# Patient Record
Sex: Male | Born: 1955 | Race: Black or African American | Hispanic: No | Marital: Single | Smoking: Former smoker
Health system: Southern US, Community
[De-identification: ages and names within clinical notes are randomized; demographics above are authoritative.]

---

## 2005-05-11 ENCOUNTER — Emergency Department (HOSPITAL_COMMUNITY): Admission: EM | Admit: 2005-05-11 | Discharge: 2005-05-11 | Payer: Self-pay | Admitting: Emergency Medicine

## 2005-05-17 ENCOUNTER — Inpatient Hospital Stay (HOSPITAL_COMMUNITY): Admission: EM | Admit: 2005-05-17 | Discharge: 2005-05-21 | Payer: Self-pay | Admitting: Emergency Medicine

## 2006-09-13 IMAGING — CR DG ABDOMEN ACUTE W/ 1V CHEST
3 series · 3 of 3 positions shown · non-contrast
Comparison: None

CLINICAL DATA: Abdominal pain

ABDOMEN SERIES - 2 VIEW & CHEST - 1 VIEW

[w chest pa *]
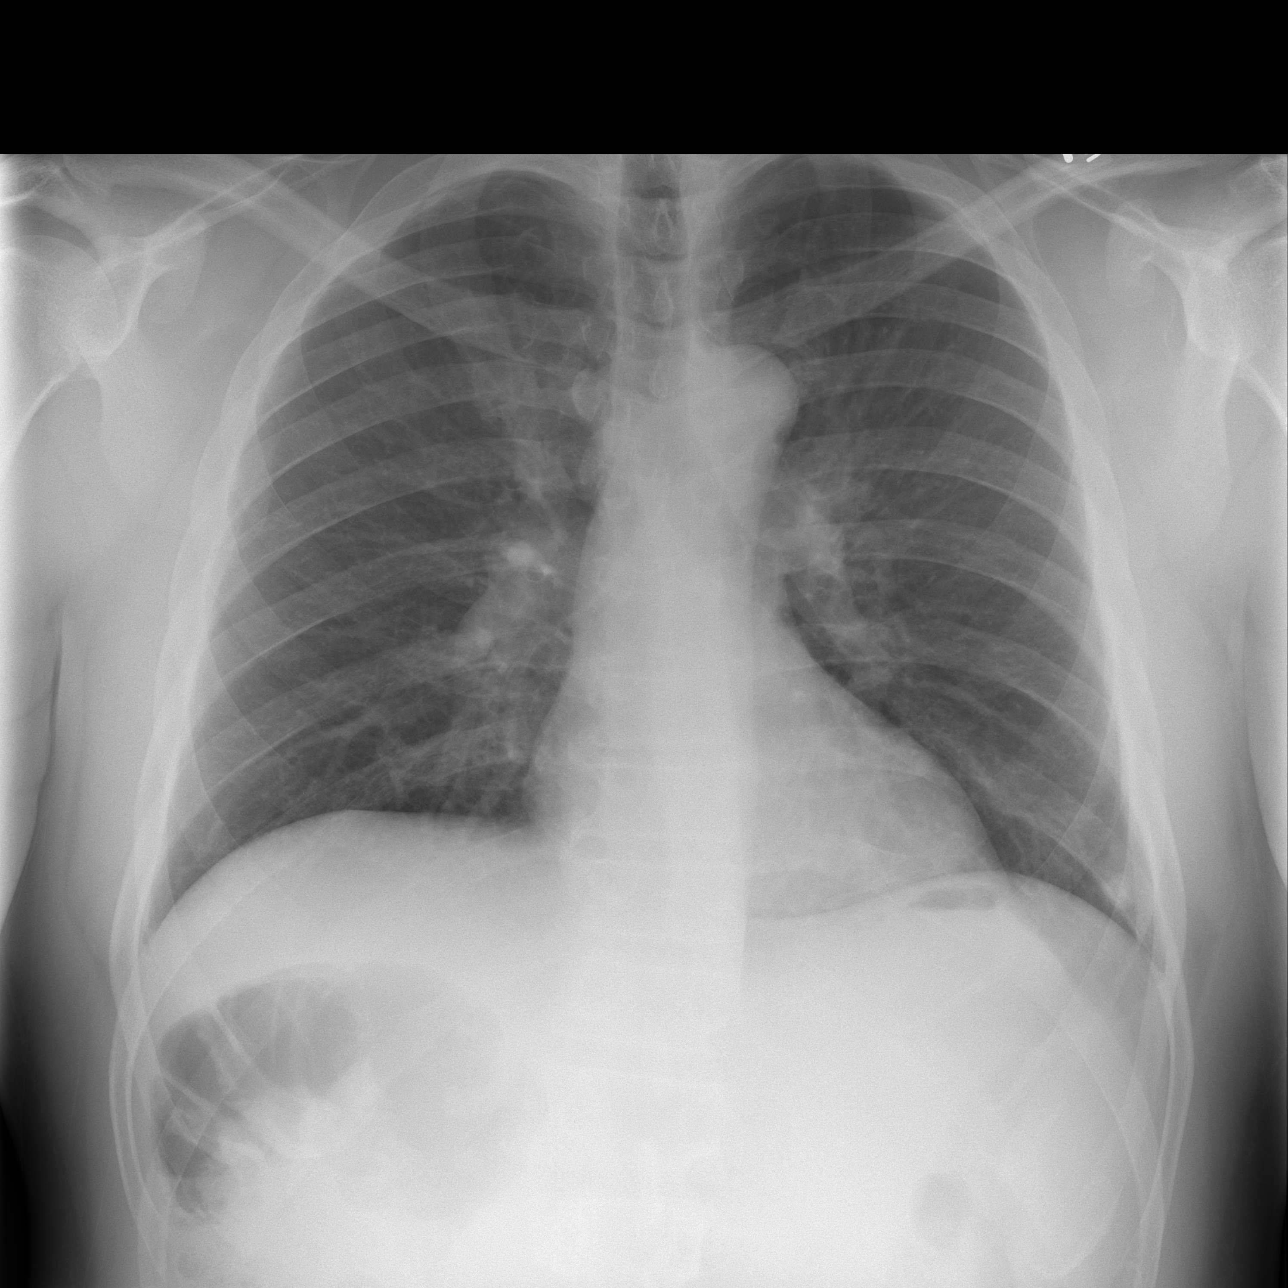

[w abdomen upright *]
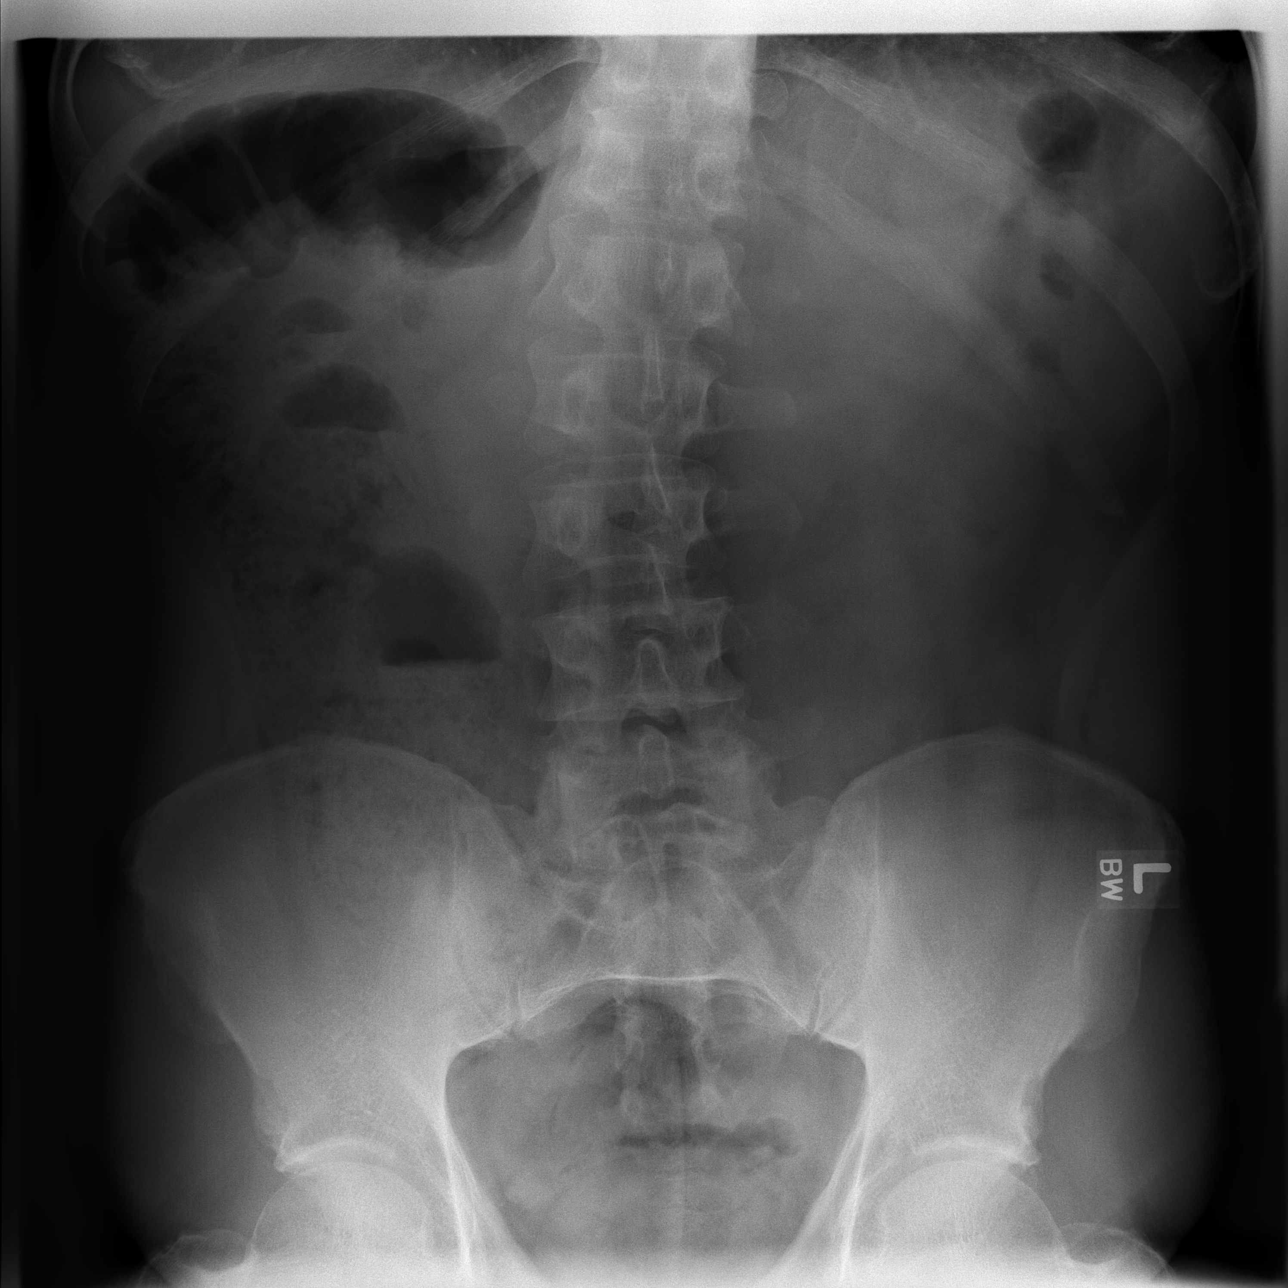

[t abdomen supine]
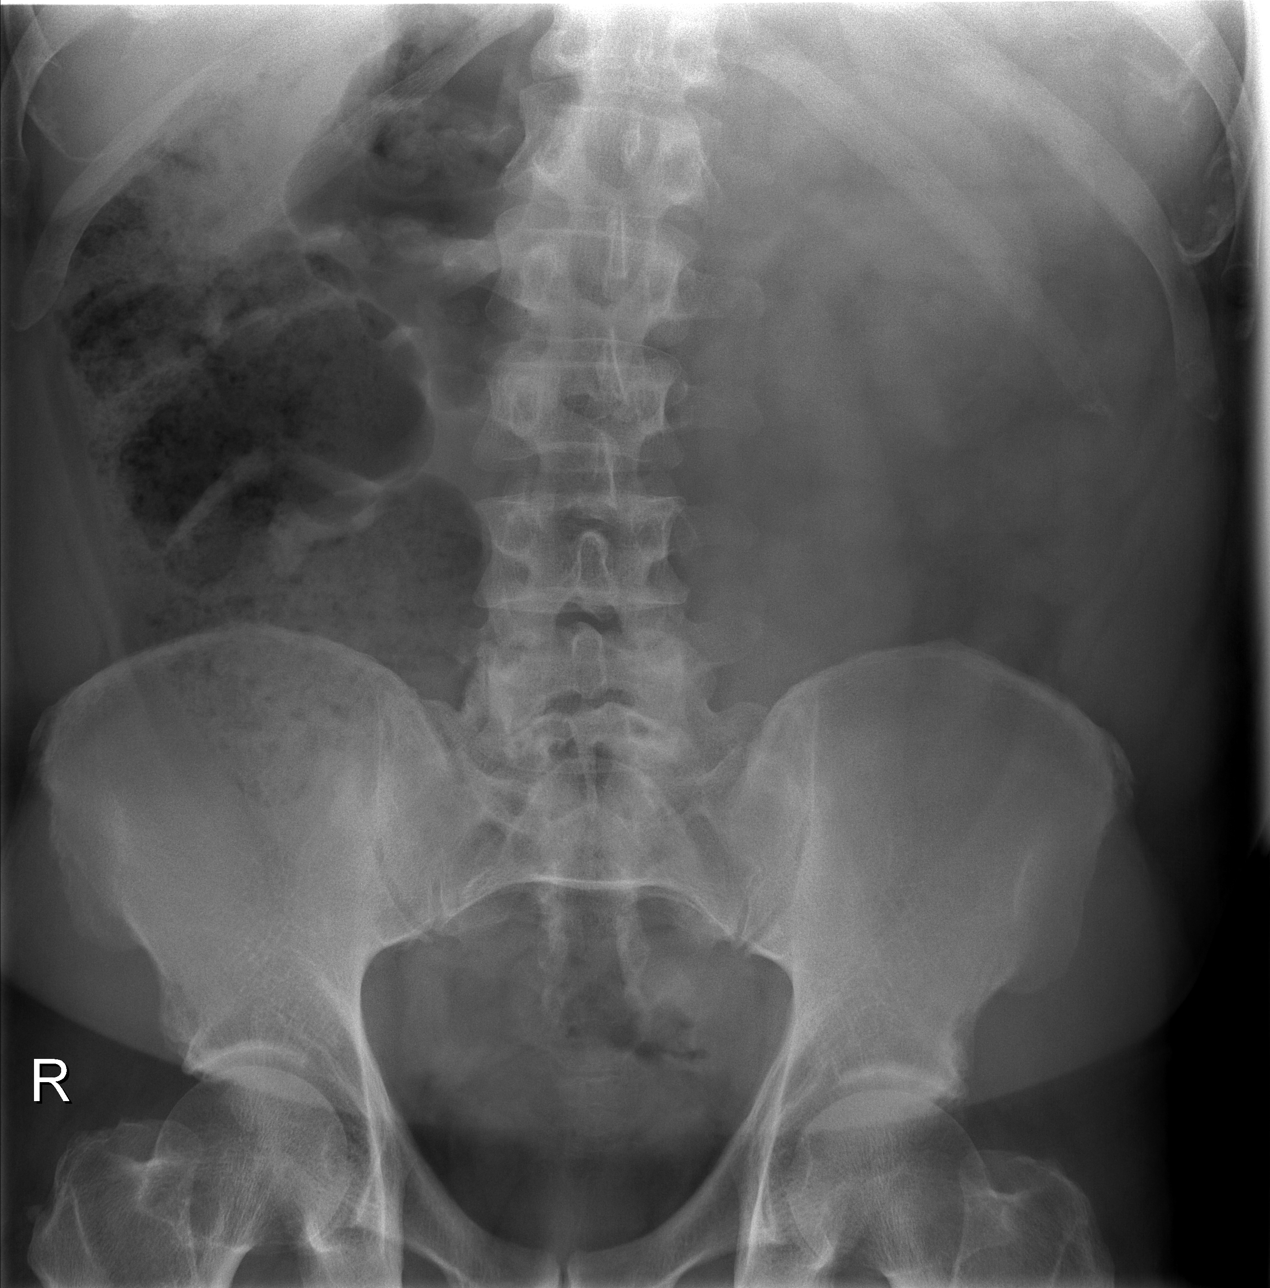

[3 of 3 positions shown; findings below may reference images not displayed]

FINDINGS: Heart and mediastinal contours are within normal limits. Right lung
clear. There is a nodular density projecting in the left costophrenic angle.
This may represent old granuloma, but cannot completely exclude other cause of
pulmonary nodule.

There is a large amount stool in the right side of the colon. No evidence of
bowel obstruction or free air. No organomegaly or suspicious calcification. Mild
scoliosis of the lumbar spine.

IMPRESSION

Constipation. No obstruction or free air.

Nodular density in the left costophrenic angle, question calcified granuloma. We
have no comparison films in our system. Therefore I recommend chest CT for
further evaluation.

## 2006-09-19 IMAGING — RF DG CHOLANGIOGRAM OPERATIVE
1 series · 4 of 4 positions shown · non-contrast
Comparison: No similar prior study is available.

CLINICAL DATA: Cholelithiasis without obstruction.  
 INTRAOPERATIVE CHOLANGIOGRAM:

[Series 1: run · 4 of 115 frames shown]
[frame 18/115]
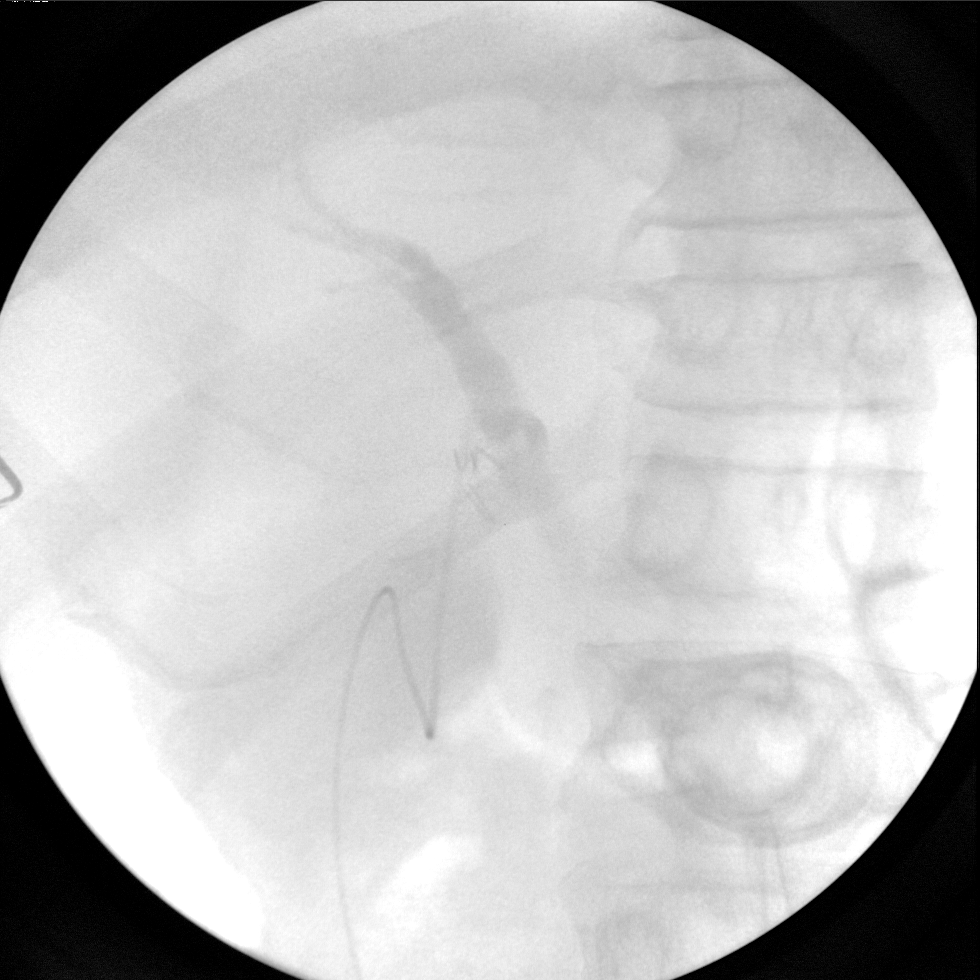
[frame 58/115]
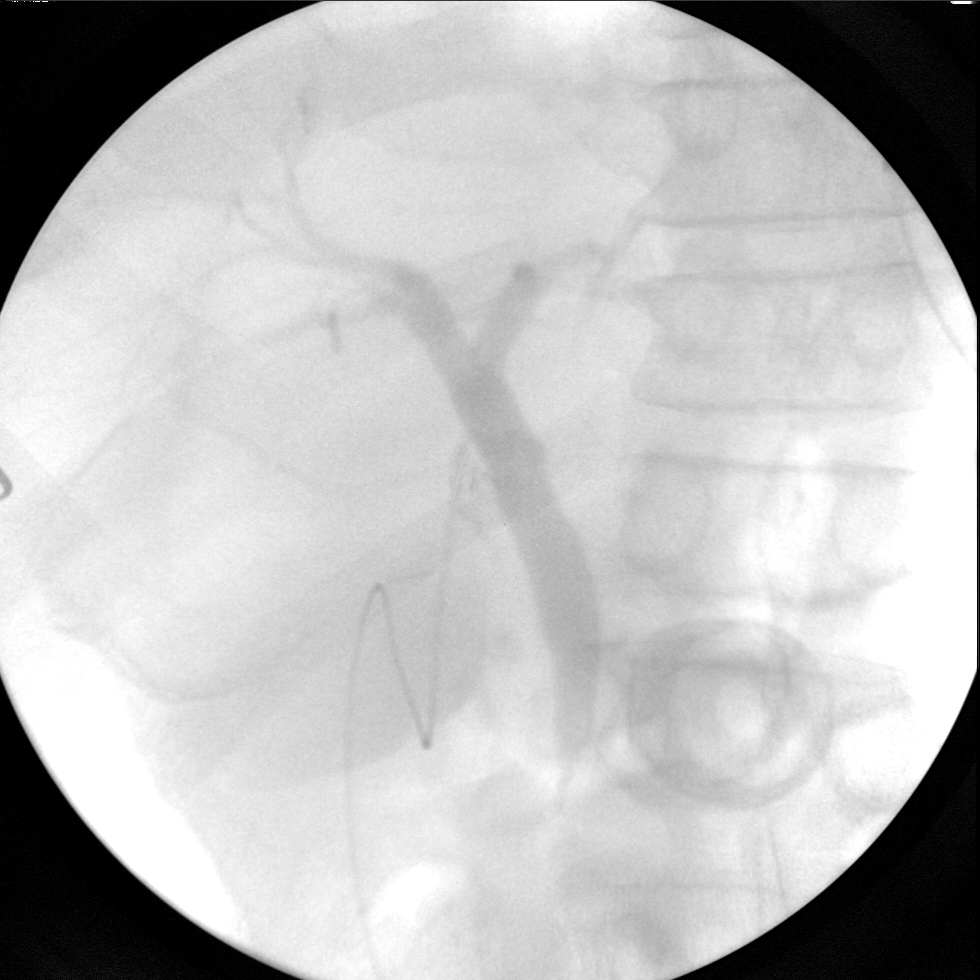
[frame 98/115]
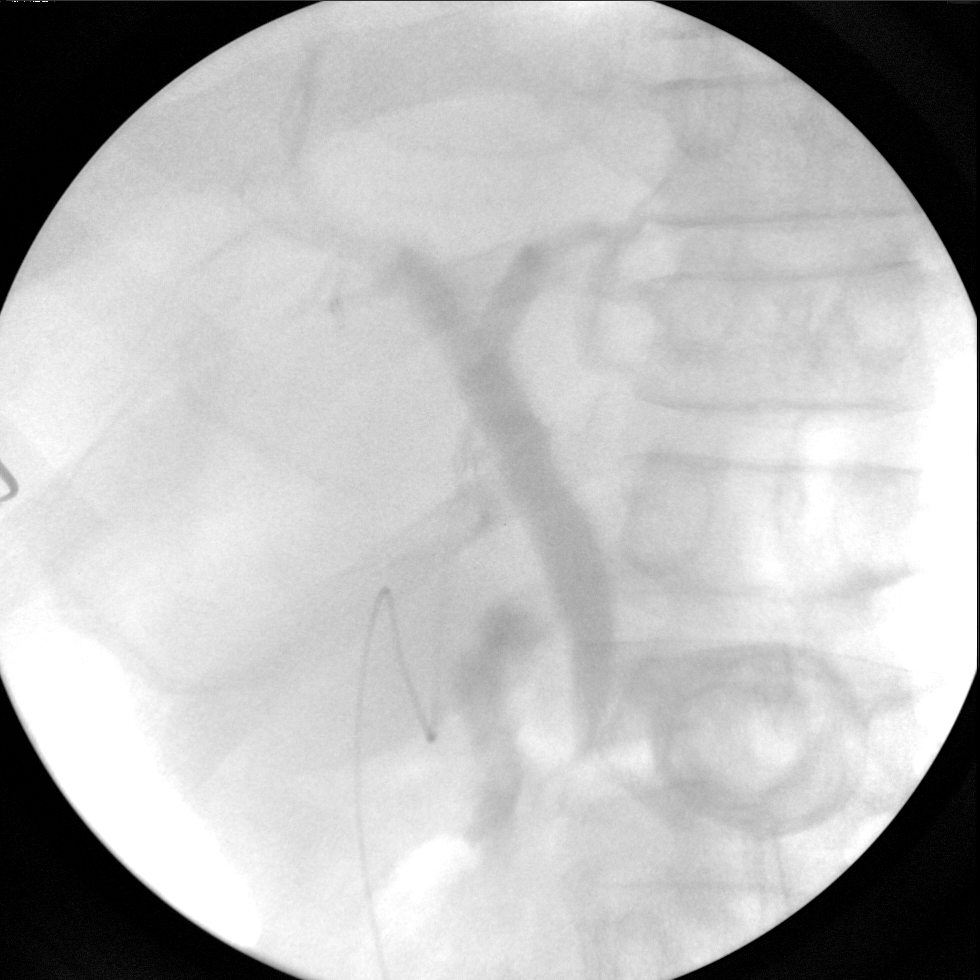
[frame 103/115]
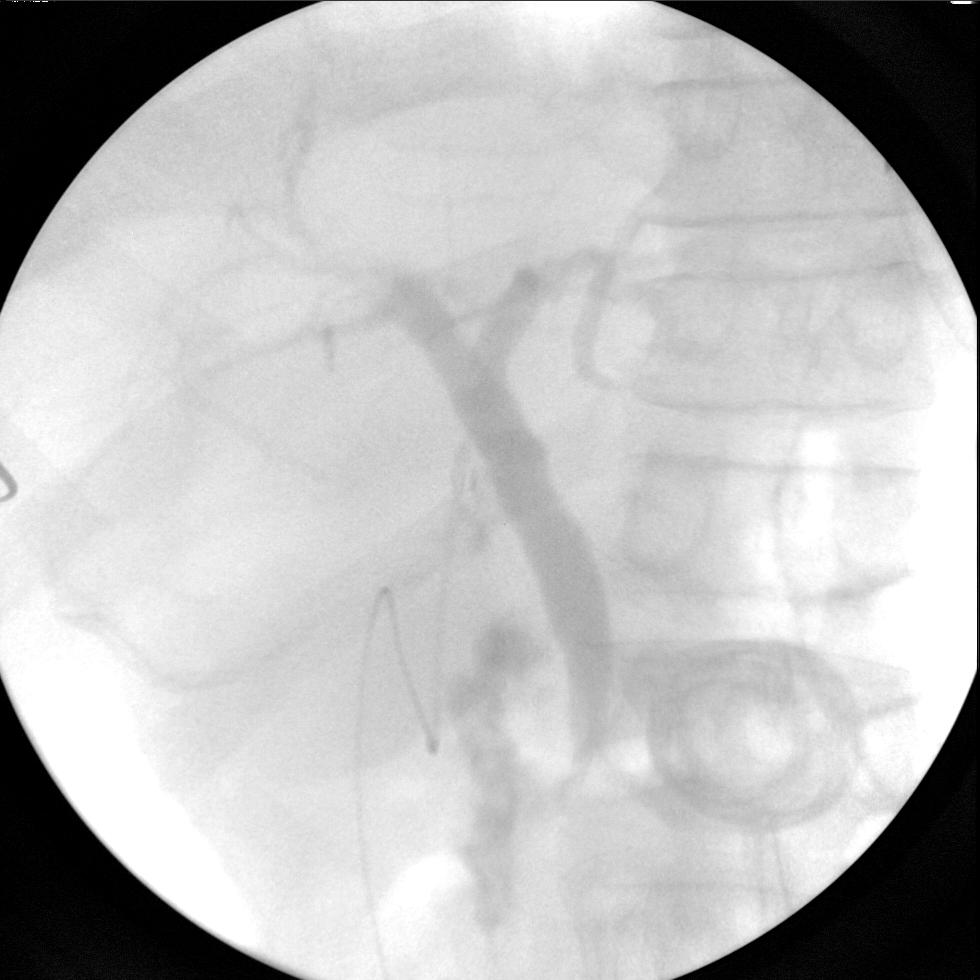

[4 of 4 positions shown; findings below may reference images not displayed]

FINDINGS: A catheter is seen over the right upper quadrant with multiple fluoroscopic images demonstrating instillation of contrast into the common duct.  The main and left and right bile ducts opacify with contrast.  No filling defect is seen in the common hepatic or common bile duct.  There is passage of contrast into the duodenum.
IMPRESSION: Intraoperative cholangiogram demonstrates opacification of the main and right and left bile ducts without filling defect or obstruction to passage into the duodenum.

## 2006-09-19 IMAGING — US US ABDOMEN COMPLETE
1 series · 14 of 25 positions shown · non-contrast
Comparison: none

CLINICAL DATA: Abdominal pain. 
 ABDOMEN ULTRASOUND:
TECHNIQUE: Complete abdominal ultrasound examination was performed including evaluation of the liver, gallbladder, bile ducts, pancreas, kidneys, spleen, IVC, and abdominal aorta.

[Series 1: abdomen · 0.33mm/px · 14 of 90 slices shown]
[im 1/90]
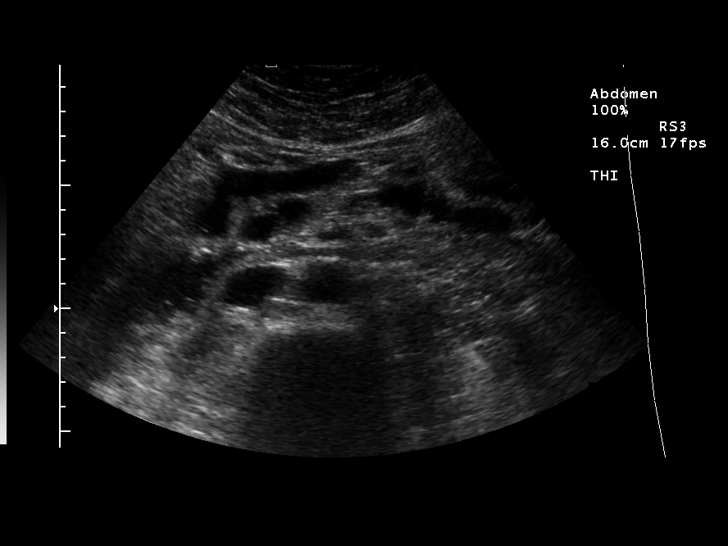
[im 8/90]
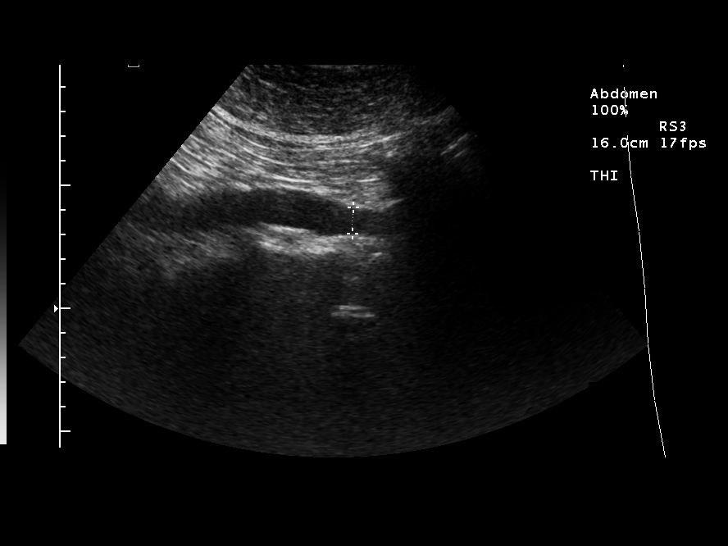
[im 15/90]
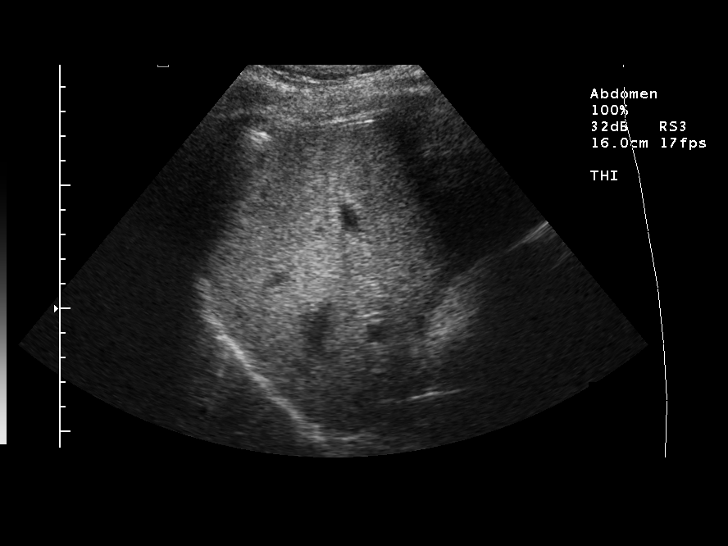
[im 23/90]
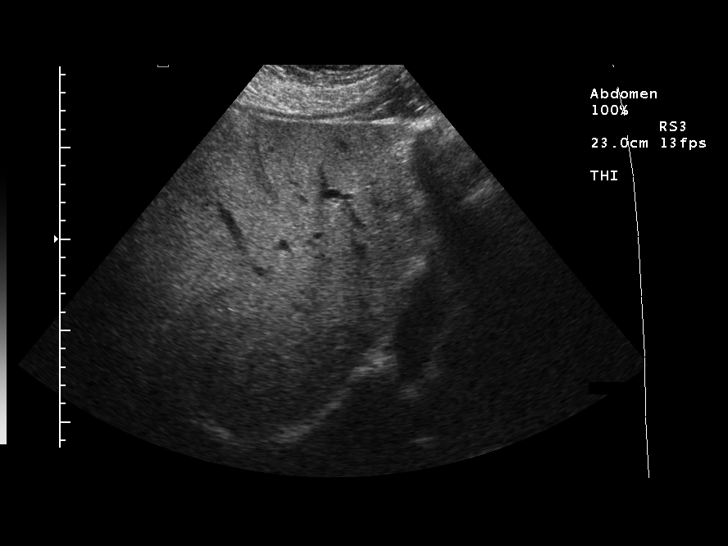
[im 30/90]
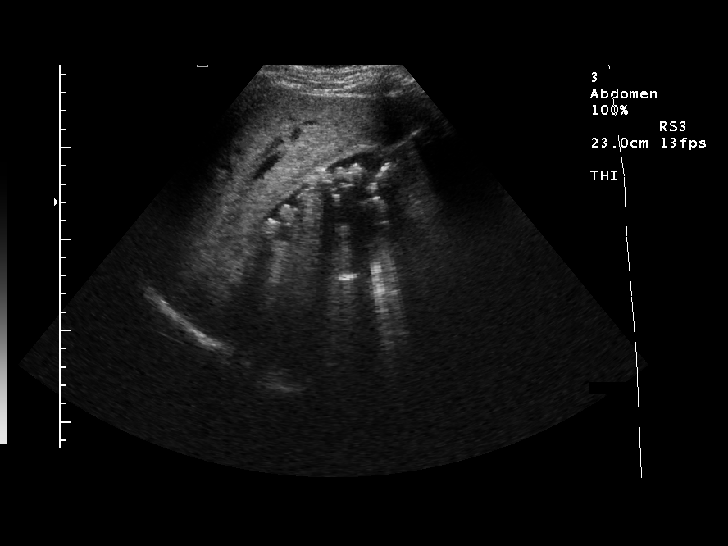
[im 34/90]
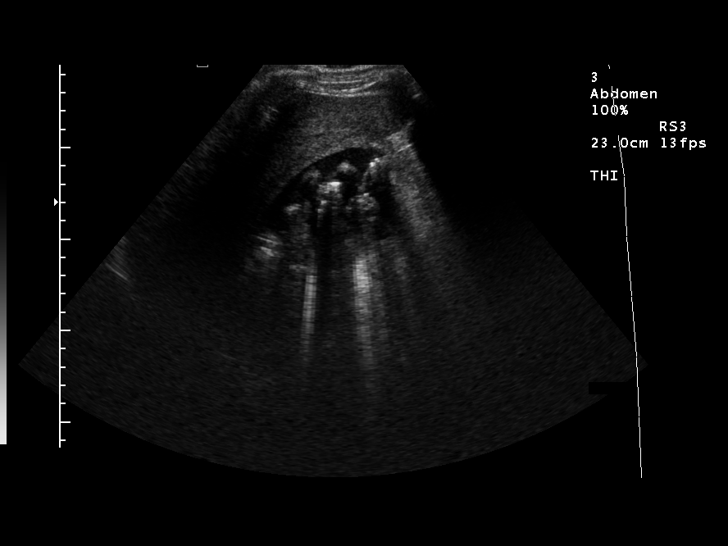
[im 41/90]
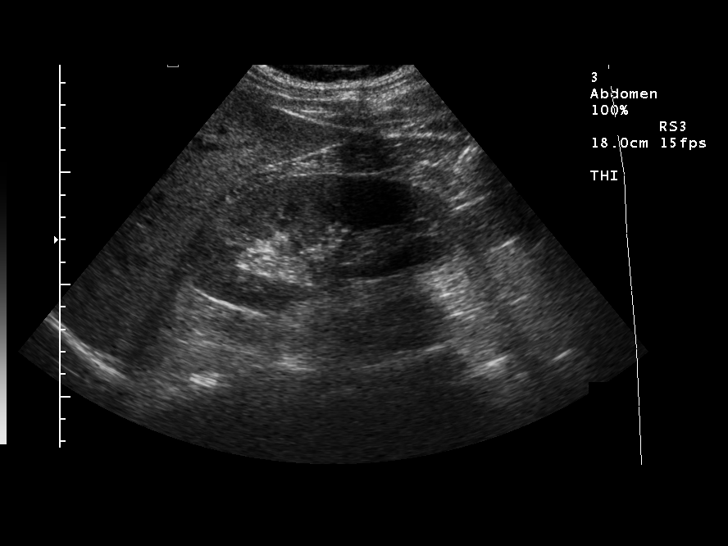
[im 49/90]
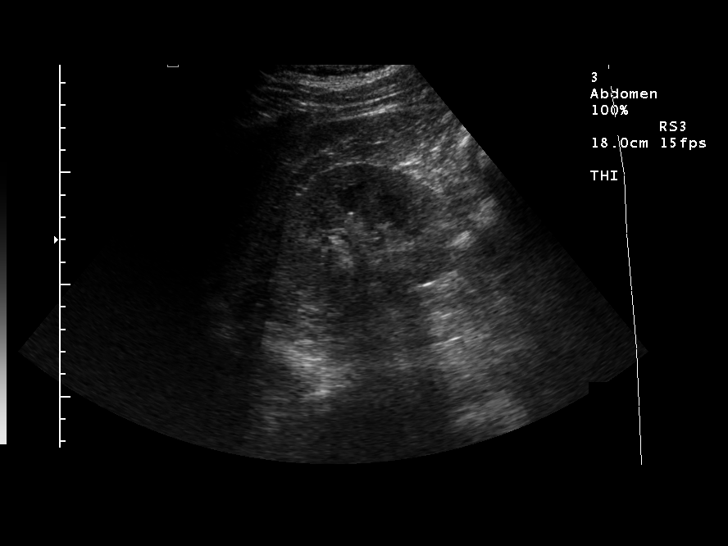
[im 56/90]
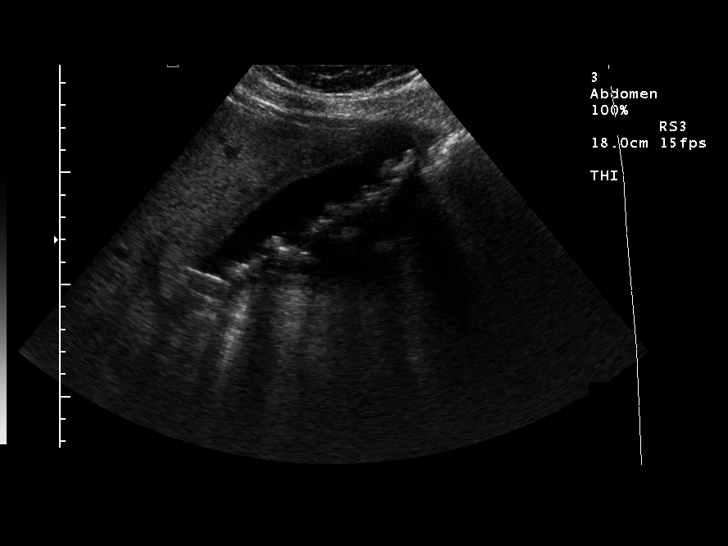
[im 60/90]
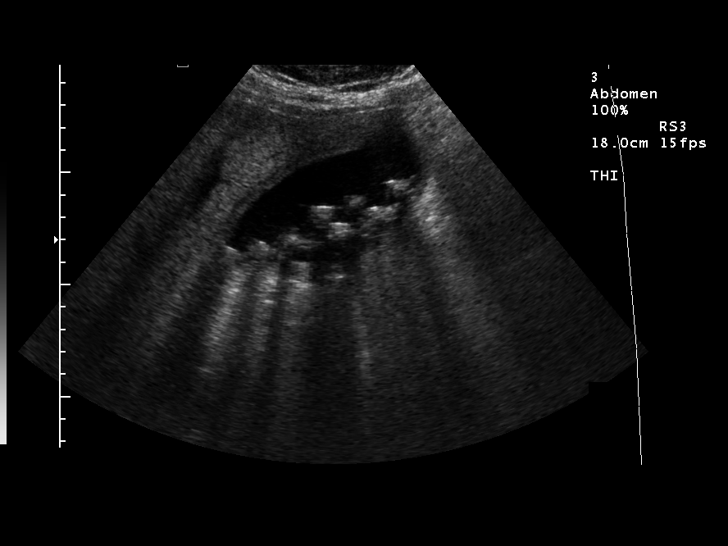
[im 67/90]
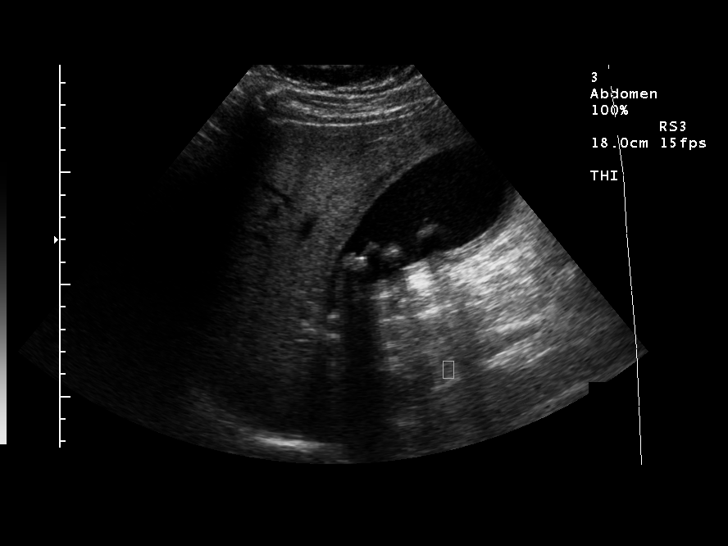
[im 75/90]
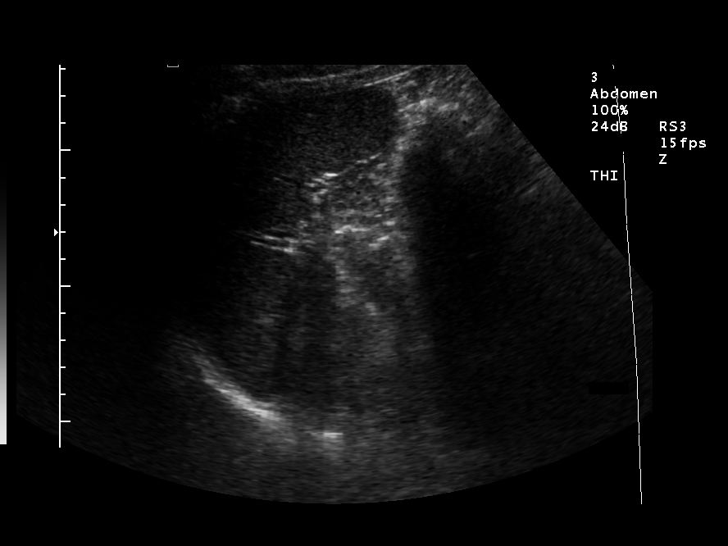
[im 82/90]
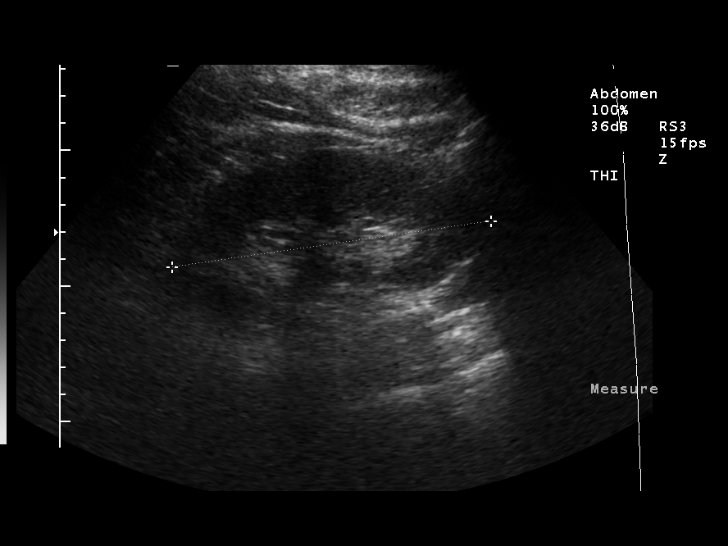
[im 90/90]
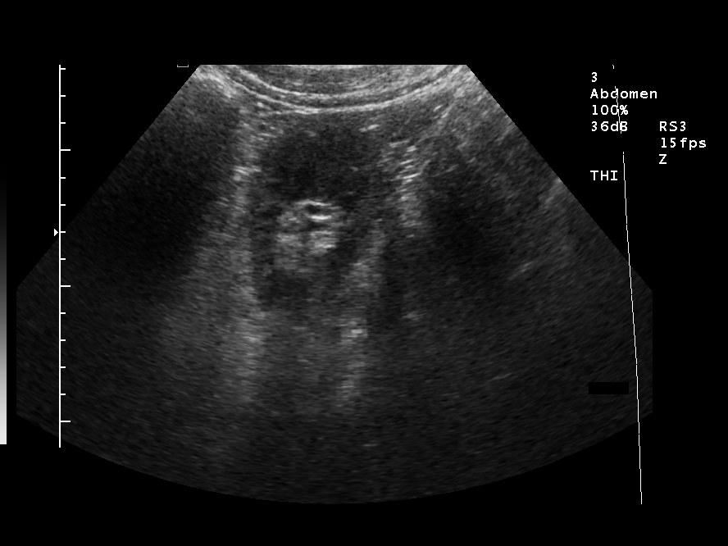

[14 of 25 positions shown; findings below may reference images not displayed]

FINDINGS: Multiple gallstones are noted with mild thickening of the gallbladder wall.  The patient is tender over the gallbladder.  No definite wall edema or pericholecystic fluid.  No intrahepatic biliary dilatation.  The common bile duct could not be visualized.  No focal lesions of the liver or spleen.  The pancreas and proximal aorta mostly obscured by bile gas.  Kidney size unremarkable without stones or obstruction.  IVC is not well visualized.
IMPRESSION: Cholelithiasis with gallbladder wall thickening and tenderness over the gallbladder.

## 2006-09-20 IMAGING — CT CT CHEST W/ CM
1 of 2 series · 14 of 32 positions shown, 18 images · IV contrast (100 ML OMNI 300)
Comparison: Chest radiograph dated 05/11/2005.

CLINICAL DATA: Nodular density at the left costophrenic angle on a recent chest
radiograph. Cholecystectomy yesterday.

CHEST CT WITH CONTRAST
TECHNIQUE: Multidetector CT imaging of the chest was performed following the
standard protocol during bolus administration of intravenous contrast.
Contrast:  100 cc Omnipaque 300

[Series 2: routine chest · axial · 0.78mm/px · z∈[-316,-61]mm · 14 of 61 slices shown, 18 images]
[im 5/61  mediastinal]
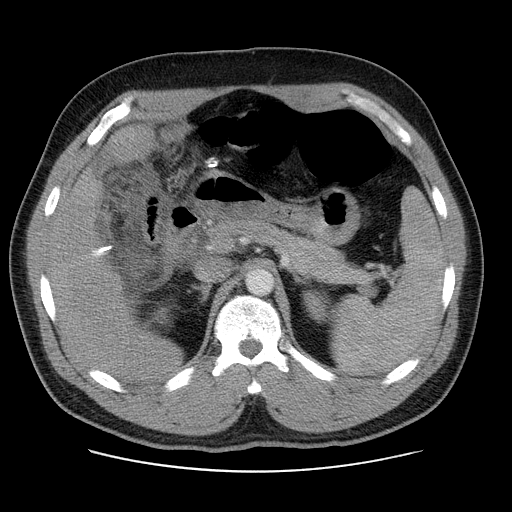
[im 5/61  lung]
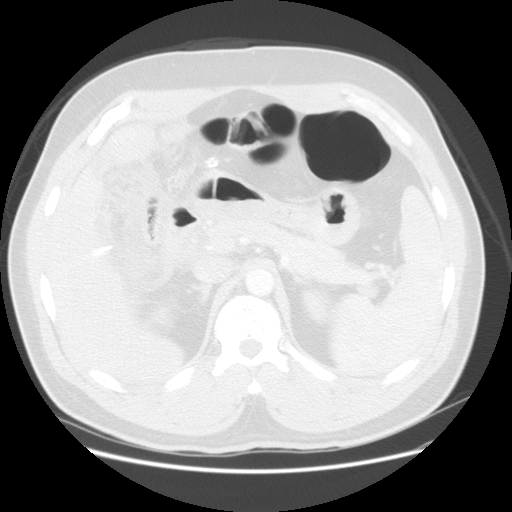
[im 10/61  lung]
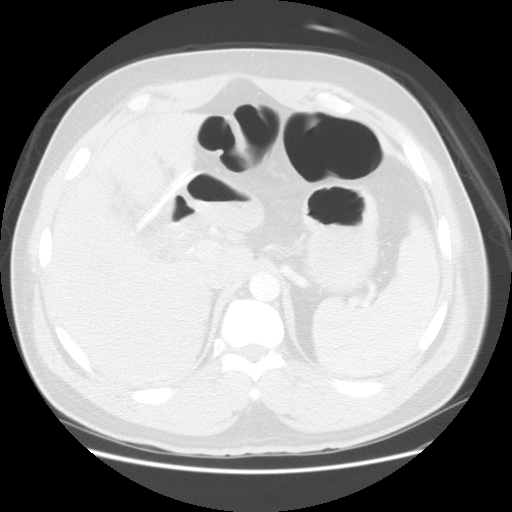
[im 14/61  lung]
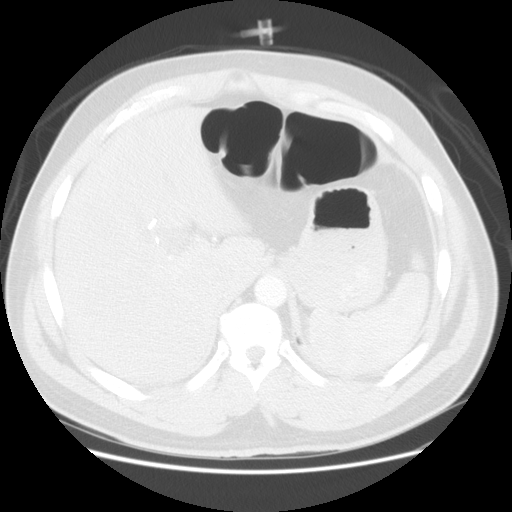
[im 19/61  lung]
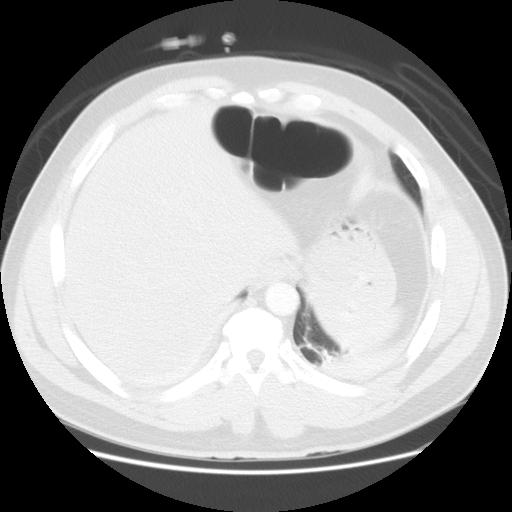
[im 24/61  mediastinal]
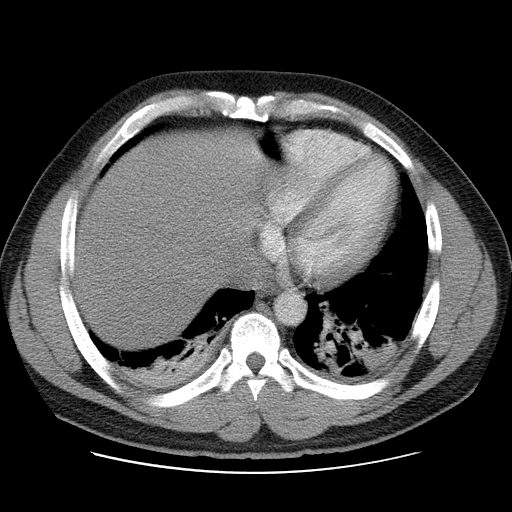
[im 24/61  lung]
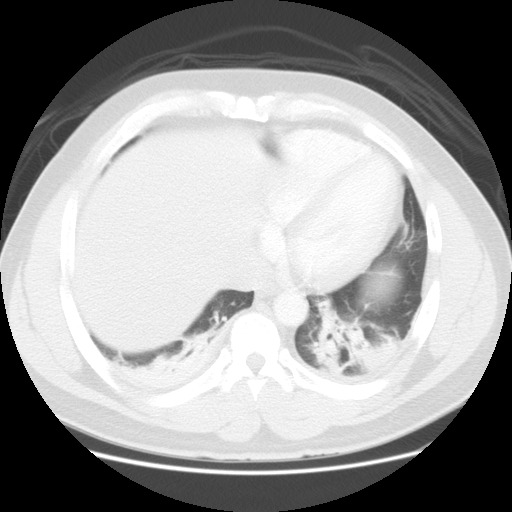
[im 28/61  lung]
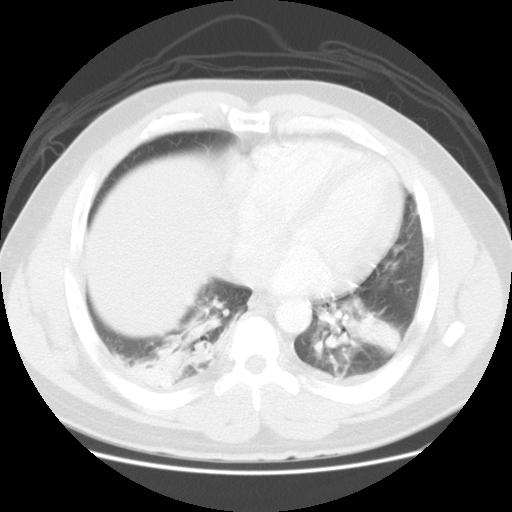
[im 30/61  lung]
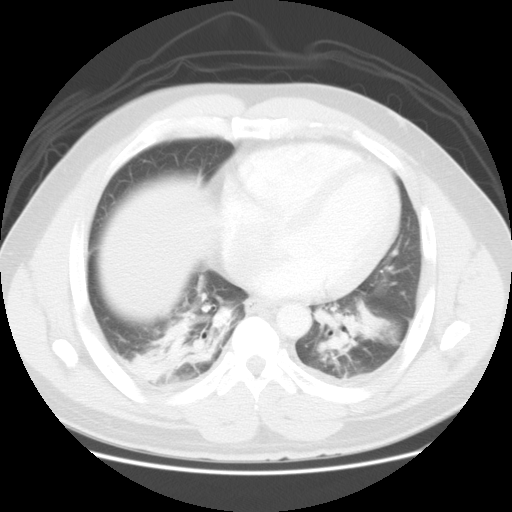
[im 31/61  lung]
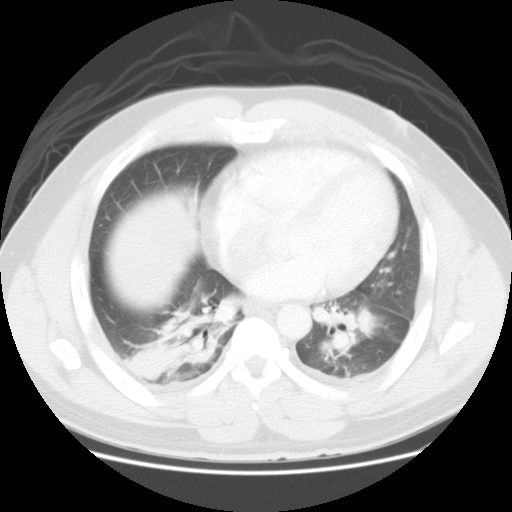
[im 33/61  mediastinal]
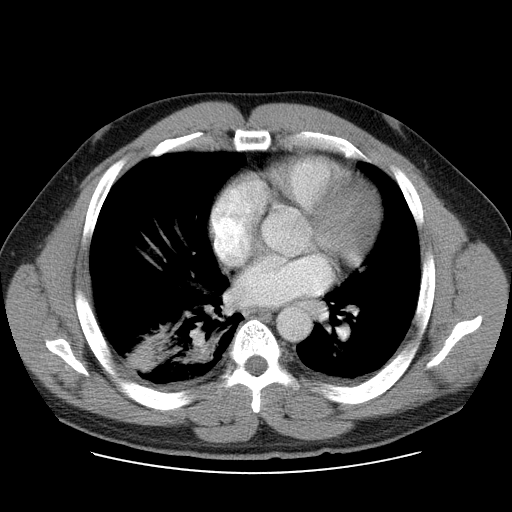
[im 33/61  lung]
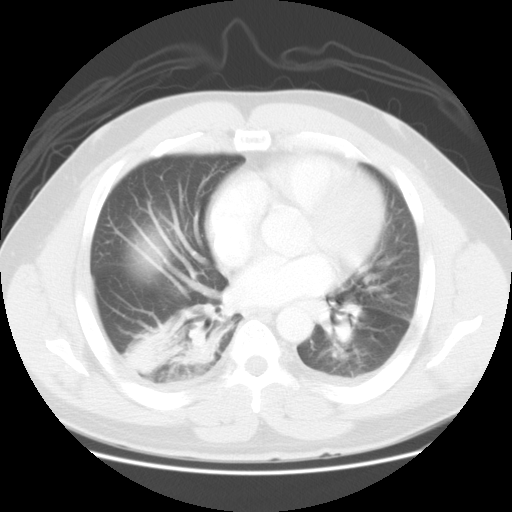
[im 37/61  lung]
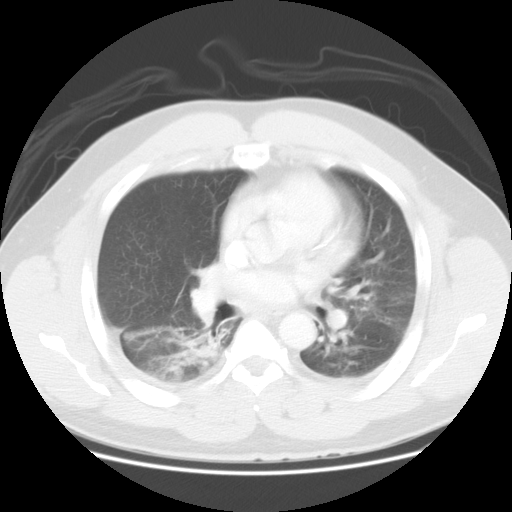
[im 42/61  lung]
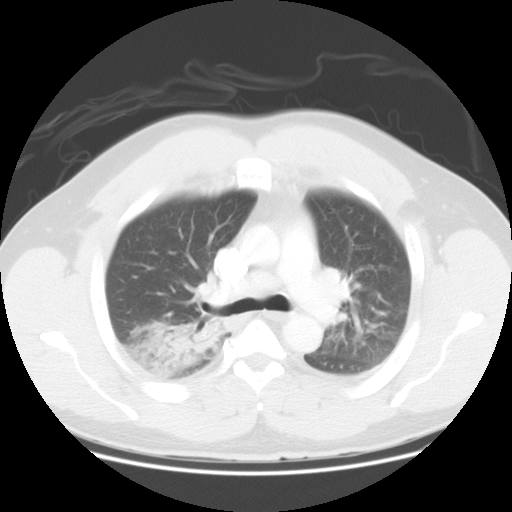
[im 47/61  lung]
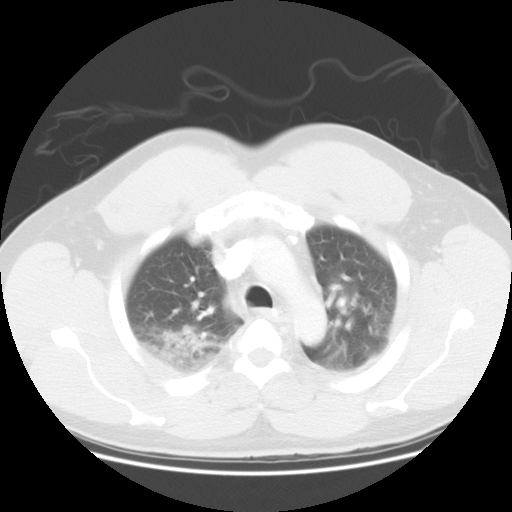
[im 51/61  mediastinal]
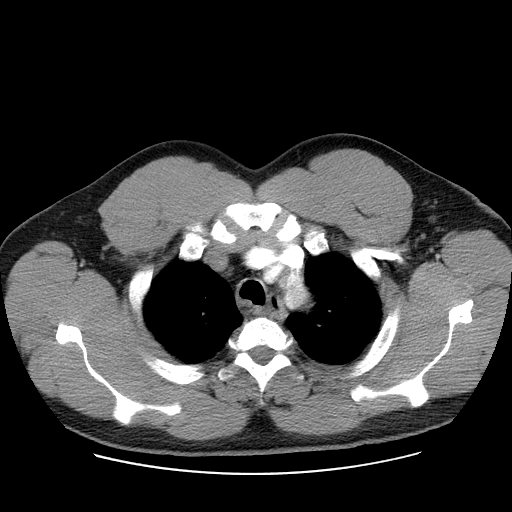
[im 51/61  lung]
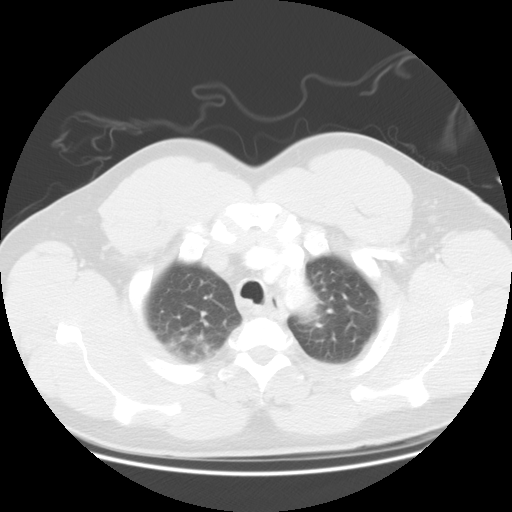
[im 56/61  lung]
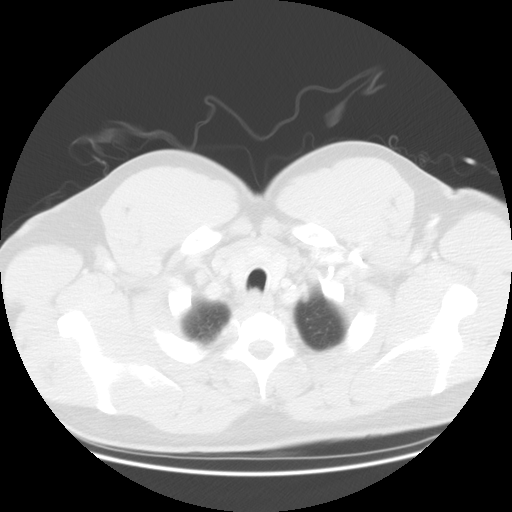

[14 of 32 positions shown; findings below may reference images not displayed]

FINDINGS: 1.2 cm densely calcified nodule in the left lower lobe, corresponding
to the nodule seen on the recent radiograph. Bilateral lower lobe atelectasis.
Patchy airspace opacity in the posterior aspect of the right upper lobe and in
the superior segment of the right lower lobe. Minimal bilateral pleural fluid.
Mild diffuse fatty infiltration of the liver. Postcholecystectomy changes in the
right upper abdomen with associated soft tissue stranding and surgical drain.
Mild thoracic spine degenerative changes.

IMPRESSION

1. 1.2 cm calcified granuloma in the left lower lobe, corresponding to the
recently demonstrated nodule.
2. Patchy atelectasis or aspiration pneumonitis in the posterior aspect of the
right upper lobe and, to a lesser degree, in the superior segment of the right
lower lobe.
3. Bilateral lower lobe atelectasis.
4. Minimal bilateral pleural fluid.
5. Status post cholecystectomy.
6. Mild diffuse fatty infiltration of the liver.

## 2013-07-10 ENCOUNTER — Emergency Department (HOSPITAL_COMMUNITY)
Admission: EM | Admit: 2013-07-10 | Discharge: 2013-07-10 | Disposition: A | Discharge status: Home / Self Care | Payer: Self-pay | Attending: Emergency Medicine | Admitting: Emergency Medicine

## 2013-07-10 ENCOUNTER — Emergency Department (HOSPITAL_COMMUNITY): Payer: Self-pay

## 2013-07-10 ENCOUNTER — Encounter (HOSPITAL_COMMUNITY): Payer: Self-pay | Admitting: Radiology

## 2013-07-10 DIAGNOSIS — R000 Tachycardia, unspecified: Secondary | ICD-10-CM | POA: Insufficient documentation

## 2013-07-10 DIAGNOSIS — R259 Unspecified abnormal involuntary movements: Secondary | ICD-10-CM | POA: Insufficient documentation

## 2013-07-10 DIAGNOSIS — R509 Fever, unspecified: Secondary | ICD-10-CM | POA: Insufficient documentation

## 2013-07-10 DIAGNOSIS — R61 Generalized hyperhidrosis: Secondary | ICD-10-CM | POA: Insufficient documentation

## 2013-07-10 DIAGNOSIS — R6510 Systemic inflammatory response syndrome (SIRS) of non-infectious origin without acute organ dysfunction: Secondary | ICD-10-CM

## 2013-07-10 DIAGNOSIS — A419 Sepsis, unspecified organism: Secondary | ICD-10-CM | POA: Insufficient documentation

## 2013-07-10 DIAGNOSIS — Z79899 Other long term (current) drug therapy: Secondary | ICD-10-CM | POA: Insufficient documentation

## 2013-07-10 DIAGNOSIS — D72829 Elevated white blood cell count, unspecified: Secondary | ICD-10-CM | POA: Insufficient documentation

## 2013-07-10 DIAGNOSIS — R51 Headache: Secondary | ICD-10-CM | POA: Insufficient documentation

## 2013-07-10 LAB — CBC WITH DIFFERENTIAL/PLATELET
Basophils Absolute: 0 10*3/uL (ref 0.0–0.1)
Basophils Relative: 0 % (ref 0–1)
Eosinophils Relative: 0 % (ref 0–5)
Hemoglobin: 13.4 g/dL (ref 13.0–17.0)
Lymphocytes Relative: 10 % — ABNORMAL LOW (ref 12–46)
MCV: 77.2 fL — ABNORMAL LOW (ref 78.0–100.0)
Monocytes Relative: 5 % (ref 3–12)
RBC: 5.09 MIL/uL (ref 4.22–5.81)
RDW: 13.7 % (ref 11.5–15.5)

## 2013-07-10 LAB — URINALYSIS, ROUTINE W REFLEX MICROSCOPIC
Glucose, UA: NEGATIVE mg/dL
Ketones, ur: NEGATIVE mg/dL
Nitrite: NEGATIVE
Specific Gravity, Urine: 1.017 (ref 1.005–1.030)

## 2013-07-10 LAB — URINE MICROSCOPIC-ADD ON

## 2013-07-10 LAB — COMPREHENSIVE METABOLIC PANEL
Alkaline Phosphatase: 81 U/L (ref 39–117)
BUN: 12 mg/dL (ref 6–23)
CO2: 20 mEq/L (ref 19–32)
Calcium: 9.1 mg/dL (ref 8.4–10.5)
GFR calc Af Amer: 68 mL/min — ABNORMAL LOW (ref >90)
GFR calc non Af Amer: 59 mL/min — ABNORMAL LOW (ref >90)
Potassium: 3 mEq/L — ABNORMAL LOW (ref 3.5–5.1)

## 2013-07-10 LAB — GLUCOSE, CAPILLARY

## 2013-07-10 MED ORDER — SODIUM CHLORIDE 0.9 % IV SOLN
1000.0000 mL | Freq: Once | INTRAVENOUS | Status: AC
  Administered 2013-07-10: 1000 mL via INTRAVENOUS

## 2013-07-10 MED ORDER — ACETAMINOPHEN 325 MG PO TABS
650.0000 mg | ORAL_TABLET | Freq: Once | ORAL | Status: AC
  Administered 2013-07-10: 650 mg via ORAL
  Filled 2013-07-10: qty 2

## 2013-07-10 MED ORDER — SODIUM CHLORIDE 0.9 % IV SOLN
1000.0000 mL | INTRAVENOUS | Status: DC
  Administered 2013-07-10: 1000 mL via INTRAVENOUS

## 2013-07-10 NOTE — ED Notes (Signed)
Old and New EKG given to Dr Opitz 

## 2013-07-10 NOTE — ED Notes (Signed)
Explained to patient and friend the importance of recommended follow up. Patient and friend verbalized and repeated MD recommendations.

## 2013-07-10 NOTE — ED Provider Notes (Signed)
CSN: [id number]     Arrival date & time 07/10/13  0306 History   First MD Initiated Contact with Patient 07/10/13 [phone]     Chief Complaint  Patient presents with  . Chills  . Fever   (Consider location/radiation/quality/duration/timing/severity/associated sxs/prior Treatment) HPI Comments: Patient is a 57 year old male with no comorbidities who presents for chills, diaphoresis, and shaking with onset 2 hours ago. Patient denies any associated symptoms prior to onset of his chills and shaking, though he does endorse a headache yesterday which was relieved by Excedrin migraine. Patient denies associated tinnitus, vision changes, neck pain or stiffness, chest pain, shortness of breath, cough or hemoptysis, nausea or vomiting, abdominal pain, urinary symptoms such as hematuria or dysuria, diarrhea, melena or hematochezia, and numbness or tingling or weakness. Patient denies any recent travel or travel outside of the country. He also denies any contact with persons who traveled outside of the country.  The history is provided by the patient. No language interpreter was used.    History reviewed. No pertinent past medical history. No past surgical history on file. No family history on file. History  Substance Use Topics  . Smoking status: Not on file  . Smokeless tobacco: Not on file  . Alcohol Use: Not on file    Review of Systems  Constitutional: Positive for chills and diaphoresis.  HENT: Negative for tinnitus and trouble swallowing.   Eyes: Negative for photophobia and visual disturbance.  Respiratory: Negative for cough and shortness of breath.   Cardiovascular: Negative for chest pain.  Gastrointestinal: Negative for nausea, vomiting, abdominal pain, diarrhea and blood in stool.  Genitourinary: Negative for dysuria and hematuria.  Skin: Negative for rash.  Neurological: Positive for headaches (resolved). Negative for syncope, weakness and numbness.  All other systems reviewed and  are negative.    Allergies  Review of patient's allergies indicates no known allergies.  Home Medications   Current Outpatient Rx  Name  Route  Sig  Dispense  Refill  . aspirin-acetaminophen-caffeine (EXCEDRIN MIGRAINE) 250-250-65 MG per tablet   Oral   Take 2 tablets by mouth every 6 (six) hours as needed for pain.         . sodium-potassium bicarbonate (ALKA-SELTZER GOLD) TBEF dissolvable tablet   Oral   Take 2 tablets by mouth daily as needed.          BP 124/64  Pulse 99  Temp(Src) 101.2 F (38.4 C) (Oral)  Resp 22  SpO2 95%  Physical Exam  Nursing note and vitals reviewed. Constitutional: He is oriented to person, place, and time. He appears well-developed and well-nourished. No distress.  HENT:  Head: Normocephalic and atraumatic.  Mouth/Throat: Oropharynx is clear and moist. No oropharyngeal exudate.  Eyes: Conjunctivae and EOM are normal. Pupils are equal, round, and reactive to light. No scleral icterus.  Neck: Normal range of motion. Neck supple.  No meningismus. Negative Brudzinski's sign.  Cardiovascular: Regular rhythm, normal heart sounds and intact distal pulses.   Pulmonary/Chest: Effort normal and breath sounds normal. No respiratory distress. He has no wheezes. He has no rales.  Abdominal: Soft. He exhibits no mass. There is no tenderness. There is no rebound and no guarding.  Musculoskeletal: Normal range of motion.  Neurological: He is alert and oriented to person, place, and time. He has normal strength. No cranial nerve deficit or sensory deficit. GCS eye subscore is 4. GCS verbal subscore is 5. GCS motor subscore is 6.  Patient speaks in full goal oriented sentences; moves  extremities without ataxia.  Skin: Skin is warm and dry. No rash noted. He is not diaphoretic. No erythema. No pallor.  Psychiatric: He has a normal mood and affect. His behavior is normal.    ED Course  Procedures (including critical care time) Labs Review Labs Reviewed   CBC WITH DIFFERENTIAL - Abnormal; Notable for the following:    WBC 10.7 (*)    MCV 77.2 (*)    Platelets 132 (*)    Neutrophils Relative % 85 (*)    Neutro Abs 9.1 (*)    Lymphocytes Relative 10 (*)    All other components within normal limits  COMPREHENSIVE METABOLIC PANEL - Abnormal; Notable for the following:    Potassium 3.0 (*)    Glucose, Bld 139 (*)    GFR calc non Af Amer 59 (*)    GFR calc Af Amer 68 (*)    All other components within normal limits  URINALYSIS, ROUTINE W REFLEX MICROSCOPIC - Abnormal; Notable for the following:    Hgb urine dipstick SMALL (*)    Leukocytes, UA MODERATE (*)    All other components within normal limits  GLUCOSE, CAPILLARY - Abnormal; Notable for the following:    Glucose-Capillary 142 (*)    All other components within normal limits  URINE MICROSCOPIC-ADD ON - Abnormal; Notable for the following:    Bacteria, UA FEW (*)    All other components within normal limits  CG4 I-STAT (LACTIC ACID) - Abnormal; Notable for the following:    Lactic Acid, Venous 3.10 (*)    All other components within normal limits  CULTURE, BLOOD (ROUTINE X 2)  CULTURE, BLOOD (ROUTINE X 2)  URINE CULTURE   Imaging Review Dg Chest 2 View  07/10/2013   CLINICAL DATA:  Fever and chills.  EXAM: CHEST  2 VIEW  COMPARISON:  05/21/2007  FINDINGS: Normal heart size and pulmonary vascularity. No focal airspace disease or consolidation in the lungs. No blunting of costophrenic angles. No pneumothorax. Mediastinal contours appear intact. Calcified granuloma in the left lung base. Degenerative changes in the thoracic spine.  IMPRESSION: No active cardiopulmonary disease.   Electronically Signed   By: Burman Nieves M.D.   On: 07/10/2013 04:16   Ct Head Wo Contrast  07/10/2013   CLINICAL DATA:  Fever and chills.  Headache.  EXAM: CT HEAD WITHOUT CONTRAST  TECHNIQUE: Contiguous axial images were obtained from the base of the skull through the vertex without intravenous  contrast.  COMPARISON:  None.  FINDINGS: Mild cerebral atrophy. No ventricular dilatation. No mass effect or midline shift. No abnormal extra-axial fluid collections. Gray-white matter junctions are distinct. Basal cisterns are not effaced. No evidence of acute intracranial hemorrhage. No depressed skull fractures. Visualized paranasal sinuses and mastoid air cells are not opacified. Vascular calcifications.  IMPRESSION: No acute intracranial abnormalities.   Electronically Signed   By: Burman Nieves M.D.   On: 07/10/2013 04:35    Date: 07/10/2013  Rate: 96  Rhythm: normal sinus rhythm  QRS Axis: normal  Intervals: normal  ST/T Wave abnormalities: normal  Conduction Disutrbances:none  Narrative Interpretation: NSR; no STEMI  Old EKG Reviewed: unchanged from 05/17/2005 I have personally reviewed and interpreted this EKG  MDM   1. Fever   2. SIRS (systemic inflammatory response syndrome)    57 year old male with no significant past medical history presents for chills, diaphoresis, and trembling with onset at 2 AM. Patient febrile on arrival to 103F and tachycardic to 118. On my initial presentation  with the patient he is diaphoretic, ill appearing, and hot to the touch, but nontoxic appearing and in no acute distress. Sepsis workup ordered in light of fever and tachycardia. Labs significant for mild leukocytosis of 10.7 as well as a lactate of 3.1. Urinalysis nonsuggestive of infection and urine culture pending. Chest x-ray shows no signs of pneumonia or consolidation. CT head also ordered as patient complained of headache yesterday which responded to Excedrin. CT head normal today.  Patient's temperature has improved to 18F after receiving Tylenol and 3 L IV fluids. Heart rate also improved to 85 bpm. I did discuss with the patient the option for lumbar puncture today in light of fever and headache which patient declines. Patient stating that he wants to go home and rest in his own bed. I have  explained to the patient that we have not been able to determine the source of the patient's fever today and that premature discharge from the hospital may lead to adverse medical outcomes and/or worsening in his health status including the possibility of death. Patient verbalizes understanding and still requesting discharge home. Patient hemodynamically stable at this time and appropriate for discharge home with primary care followup in 24-48 hours. I provided resource guide as well as referral to [facility]. Return precautions discussed with the patient who verbalizes comfort and understanding with this discharge plan.    Antony Madura, PA-C 07/10/13 [phone]

## 2013-07-10 NOTE — ED Notes (Signed)
Checked CBG 142

## 2013-07-10 NOTE — ED Notes (Addendum)
Patient and friend extremely concerned about MD recommendation of lumbar puncture. Spoke with patient and family member at length about procedure. PA informed of concerns.

## 2013-07-10 NOTE — ED Notes (Signed)
Patient arrives with chills, thirst, and trembling starting around 2am this morning. Patient reports no symptoms prior this am. Had a headache yesterday, but was relieved by excedrin.

## 2013-07-11 ENCOUNTER — Telehealth (HOSPITAL_COMMUNITY): Payer: Self-pay

## 2013-07-11 ENCOUNTER — Telehealth (HOSPITAL_COMMUNITY): Payer: Self-pay | Admitting: Emergency Medicine

## 2013-07-11 LAB — URINE CULTURE: Colony Count: 80000

## 2013-07-11 NOTE — ED Notes (Signed)
LVM requesting callback.  Contact info provided.

## 2013-07-11 NOTE — ED Notes (Signed)
Call From Dr Dierdre Highman.  Pt has UTI not sent home on any abx.  Please notify pt and call in "Keflex 500 mg 1 qid x 7 days"

## 2013-07-11 NOTE — ED Provider Notes (Signed)
Medical screening examination/treatment/procedure(s) were conducted as a shared visit with non-physician practitioner(s) and myself.  I personally evaluated the patient during the encounter  Fever and chills with some headache yesterday now resolved. Denies any other symptoms. No recent sick contacts or recent travel. Exam is tachycardic, oral pharynx clear, dry mucous membranes, abdomen soft nontender nondistended, neck full range of motion without nuchal rigidity, no rash, lungs clear, no neuro deficits  Workup review with patient and lumbar puncture offered/ recommended. Had multiple conversations with patient and he declines LP, which I think at this time is reasonable given no meningismus.  Heart rate improved with IV fluids  Plan close outpatient followup with return precautions provided and verbalized as understood  Discharged with Diagnosis fever no known source  On review if UA and U CX 80K E coli sensitivities pending, has UTI.  I discussed with flow manager, will attempt to call PT this am and call in ABx - Kelfex    Sunnie Nielsen, MD 07/11/13 [phone]

## 2013-07-13 NOTE — ED Notes (Addendum)
Patient returned call and was informed of results and requested order be called to [facility] [phone] Called to pharmacy by Sheralyn Boatman PFM.

## 2013-07-16 LAB — CULTURE, BLOOD (ROUTINE X 2): Culture: NO GROWTH

## 2020-05-26 ENCOUNTER — Encounter (HOSPITAL_COMMUNITY): Payer: Self-pay | Admitting: Obstetrics and Gynecology

## 2020-05-26 ENCOUNTER — Emergency Department (HOSPITAL_COMMUNITY): Payer: Self-pay

## 2020-05-26 ENCOUNTER — Emergency Department (HOSPITAL_COMMUNITY)
Admission: EM | Admit: 2020-05-26 | Discharge: 2020-05-26 | Disposition: A | Discharge status: Home / Self Care | Payer: Self-pay | Attending: Emergency Medicine | Admitting: Emergency Medicine

## 2020-05-26 ENCOUNTER — Other Ambulatory Visit: Payer: Self-pay

## 2020-05-26 DIAGNOSIS — Z87891 Personal history of nicotine dependence: Secondary | ICD-10-CM | POA: Insufficient documentation

## 2020-05-26 DIAGNOSIS — K5900 Constipation, unspecified: Secondary | ICD-10-CM | POA: Insufficient documentation

## 2020-05-26 DIAGNOSIS — Z7982 Long term (current) use of aspirin: Secondary | ICD-10-CM | POA: Insufficient documentation

## 2020-05-26 LAB — BASIC METABOLIC PANEL
Anion gap: 12 (ref 5–15)
BUN: 13 mg/dL (ref 8–23)
CO2: 23 mmol/L (ref 22–32)
Calcium: 9.3 mg/dL (ref 8.9–10.3)
Chloride: 107 mmol/L (ref 98–111)
Creatinine, Ser: 1.02 mg/dL (ref 0.61–1.24)
GFR calc Af Amer: >60 mL/min (ref >60)
GFR calc non Af Amer: >60 mL/min (ref >60)
Glucose, Bld: 131 mg/dL — ABNORMAL HIGH (ref 70–99)
Potassium: 4.2 mmol/L (ref 3.5–5.1)
Sodium: 142 mmol/L (ref 135–145)

## 2020-05-26 LAB — CBC
HCT: 43.2 % (ref 39.0–52.0)
Hemoglobin: 14.1 g/dL (ref 13.0–17.0)
MCH: 26.3 pg (ref 26.0–34.0)
MCHC: 32.6 g/dL (ref 30.0–36.0)
MCV: 80.6 fL (ref 80.0–100.0)
Platelets: 193 10*3/uL (ref 150–400)
RBC: 5.36 MIL/uL (ref 4.22–5.81)
RDW: 13.6 % (ref 11.5–15.5)
WBC: 7 10*3/uL (ref 4.0–10.5)
nRBC: 0 % (ref 0.0–0.2)

## 2020-05-26 NOTE — Discharge Instructions (Addendum)
GETTING TO GOOD BOWEL HEALTH. Please drink a clear liquid diet until you have a bowel movement.  Please use 1 cap of MiraLAX into a cup of water with each meal. Please follow-up closely with your primary care doctor.  If you not have a bowel movement in the next 5 days please return the emergency department.  If you have any new or worsening symptom such as nausea, vomiting, or or abdominal pain please return more stiffly to the emergency department.        Irregular bowel habits such as constipation and diarrhea can lead to many problems over time.  Having one soft bowel movement a day is the most important way to prevent further problems.  The anorectal canal is designed to handle stretching and feces to safely manage our ability to get rid of solid waste (feces, poop, stool) out of our body.  BUT, hard constipated stools can act like ripping concrete bricks and diarrhea can be a burning fire to this very sensitive area of our body, causing inflamed hemorrhoids, anal fissures, increasing risk is perirectal abscesses, abdominal pain/bloating, an making irritable bowel worse.     The goal: ONE SOFT BOWEL MOVEMENT A DAY!  To have soft, regular bowel movements:  Drink at least 8 tall glasses of water a day.   Take plenty of fiber.  Fiber is the undigested part of plant food that passes into the colon, acting s "natures broom" to encourage bowel motility and movement.  Fiber can absorb and hold large amounts of water. This results in a larger, bulkier stool, which is soft and easier to pass. Work gradually over several weeks up to 6 servings a day of fiber (25g a day even more if needed) in the form of: Vegetables -- Root (potatoes, carrots, turnips), leafy green (lettuce, salad greens, celery, spinach), or cooked high residue (cabbage, broccoli, etc) Fruit -- Fresh (unpeeled skin & pulp), Dried (prunes, apricots, cherries, etc ),  or stewed ( applesauce)  Whole grain breads, pasta, etc (whole wheat)    Bran cereals  Bulking Agents -- This type of water-retaining fiber generally is easily obtained each day by one of the following:  Psyllium bran -- The psyllium plant is remarkable because its ground seeds can retain so much water. This product is available as Metamucil, Konsyl, Effersyllium, Per Diem Fiber, or the less expensive generic preparation in drug and health food stores. Although labeled a laxative, it really is not a laxative.  Methylcellulose -- This is another fiber derived from wood which also retains water. It is available as Citrucel. Polyethylene Glycol - and "artificial" fiber commonly called Miralax or Glycolax.  It is helpful for people with gassy or bloated feelings with regular fiber Flax Seed - a less gassy fiber than psyllium No reading or other relaxing activity while on the toilet. If bowel movements take longer than 5 minutes, you are too constipated AVOID CONSTIPATION.  High fiber and water intake usually takes care of this.  Sometimes a laxative is needed to stimulate more frequent bowel movements, but  Laxatives are not a good long-term solution as it can wear the colon out. Osmotics (Milk of Magnesia, Fleets phosphosoda, Magnesium citrate, MiraLax, GoLytely) are safer than  Stimulants (Senokot, Castor Oil, Dulcolax, Ex Lax)    Do not take laxatives for more than 7days in a row.  IF SEVERELY CONSTIPATED, try a Bowel Retraining Program: Do not use laxatives.  Eat a diet high in roughage, such as bran cereals and  leafy vegetables.  Drink six (6) ounces of prune or apricot juice each morning.  Eat two (2) large servings of stewed fruit each day.  Take one (1) heaping tablespoon of a psyllium-based bulking agent twice a day. Use sugar-free sweetener when possible to avoid excessive calories.  Eat a normal breakfast.  Set aside 15 minutes after breakfast to sit on the toilet, but do not strain to have a bowel movement.  If you do not have a bowel movement by the third  day, use an enema and repeat the above steps.  Controlling diarrhea Switch to liquids and simpler foods for a few days to avoid stressing your intestines further. Avoid dairy products (especially milk & ice cream) for a short time.  The intestines often can lose the ability to digest lactose when stressed. Avoid foods that cause gassiness or bloating.  Typical foods include beans and other legumes, cabbage, broccoli, and dairy foods.  Every person has some sensitivity to other foods, so listen to our body and avoid those foods that trigger problems for you. Adding fiber (Citrucel, Metamucil, psyllium, Miralax) gradually can help thicken stools by absorbing excess fluid and retrain the intestines to act more normally.  Slowly increase the dose over a few weeks.  Too much fiber too soon can backfire and cause cramping & bloating. Probiotics (such as active yogurt, Align, etc) may help repopulate the intestines and colon with normal bacteria and calm down a sensitive digestive tract.  Most studies show it to be of mild help, though, and such products can be costly. Medicines: Bismuth subsalicylate (ex. Kayopectate, Pepto Bismol) every 30 minutes for up to 6 doses can help control diarrhea.  Avoid if pregnant. Loperamide (Immodium) can slow down diarrhea.  Start with two tablets (4mg  total) first and then try one tablet every 6 hours.  Avoid if you are having fevers or severe pain.  If you are not better or start feeling worse, stop all medicines and call your doctor for advice Call your doctor if you are getting worse or not better.  Sometimes further testing (cultures, endoscopy, X-ray studies, bloodwork, etc) may be needed to help diagnose and treat the cause of the diarrhea.  Managing Pain  Pain after surgery or related to activity is often due to strain/injury to muscle, tendon, nerves and/or incisions.  This pain is usually short-term and will improve in a few months.   Many people find it helpful  to do the following things TOGETHER to help speed the process of healing and to get back to regular activity more quickly:  Avoid heavy physical activity  no lifting greater than 20 pounds Do not "push through" the pain.  Listen to your body and avoid positions and maneuvers than reproduce the pain Walking is okay as tolerated, but go slowly and stop when getting sore.  Remember: If it hurts to do it, then don't do it! Take Anti-inflammatory medication  Take with food/snack around the clock for 1-2 weeks This helps the muscle and nerve tissues become less irritable and calm down faster Choose ONE of the following over-the-counter medications: Naproxen 220mg  tabs (ex. Aleve) 1-2 pills twice a day  Ibuprofen 200mg  tabs (ex. Advil, Motrin) 3-4 pills with every meal and just before bedtime Acetaminophen 500mg  tabs (Tylenol) 1-2 pills with every meal and just before bedtime Use a Heating pad or Ice/Cold Pack 4-6 times a day May use warm bath/hottub  or showers Try Gentle Massage and/or Stretching  at the area of  pain many times a day stop if you feel pain - do not overdo it  Try these steps together to help you body heal faster and avoid making things get worse.  Doing just one of these things may not be enough.    If you are not getting better after two weeks or are noticing you are getting worse, contact our office for further advice; we may need to re-evaluate you & see what other things we can do to help.

## 2020-05-26 NOTE — ED Provider Notes (Signed)
[facility]-EMERGENCY DEPT Provider Note   CSN: [id number] Arrival date & time: 05/26/20  0548     History Chief Complaint  Patient presents with  . Constipation    Clayton Kirk is a 64 y.o. male.  HPI Patient is a 64 year old male with no pertinent past medical history presented today for chief complaint of constipation.  Patient states that he has not been for 2 days.  He states he normally poops every day.  Specifically he states that he stopped having bowel movements when he woke up yesterday and has not had one today.  He states he has issues with constipation when he eats she is any states that he did have she is couple days ago.  He denies any nausea, vomiting, diarrhea, lightheadedness, dizziness abdominal pain chest pain or shortness of breath.  States he is feeling completely well but came to the emergency department for evaluation because of his abnormal bowel movement pattern.  He states he does not have a primary care doctor.  He does however state that he is going to follow-up with 1 and is inquiring about information for 1.  He denies any associated symptoms.  Denies any aggravating or mitigating factors--other than she is aggravating his bowel movement issues.  He has a past medical history significant for gallbladder removal but no other abdominal surgeries.  He states he does not member passing gas today but he did pass gas yesterday.    History reviewed. No pertinent past medical history.  There are no problems to display for this patient.   History reviewed. No pertinent surgical history.     No family history on file.  Social History   Tobacco Use  . Smoking status: Former Games developer  . Smokeless tobacco: Never Used  Substance Use Topics  . Alcohol use: Not Currently  . Drug use: Not Currently    Home Medications Prior to Admission medications   Medication Sig Start Date End Date Taking? Authorizing Provider    aspirin-acetaminophen-caffeine (EXCEDRIN MIGRAINE) [phone] MG per tablet Take 2 tablets by mouth every 6 (six) hours as needed for pain.    [provider]  sodium-potassium bicarbonate (ALKA-SELTZER GOLD) TBEF dissolvable tablet Take 2 tablets by mouth daily as needed.    [provider]    Allergies    Patient has no known allergies.  Review of Systems   Review of Systems  Constitutional: Negative for chills and fever.  HENT: Negative for congestion.   Eyes: Negative for pain.  Respiratory: Negative for cough and shortness of breath.   Cardiovascular: Negative for chest pain and leg swelling.  Gastrointestinal: Positive for constipation. Negative for abdominal pain, diarrhea, nausea and vomiting.  Genitourinary: Negative for dysuria.  Musculoskeletal: Negative for myalgias.  Skin: Negative for rash.  Neurological: Negative for dizziness and headaches.    Physical Exam Updated Vital Signs BP (!) 164/84 (BP Location: Right Arm)   Pulse 62   Temp 98.3 F (36.8 C) (Oral)   Resp 18   Ht 6\' 4"  (1.93 m)   Wt 122.5 kg   SpO2 100%   BMI 32.87 kg/m   Physical Exam Vitals and nursing note reviewed.  Constitutional:      General: He is not in acute distress. HENT:     Head: Normocephalic and atraumatic.     Nose: Nose normal.     Mouth/Throat:     Mouth: Mucous membranes are moist.  Eyes:     General: No scleral  icterus. Cardiovascular:     Rate and Rhythm: Normal rate and regular rhythm.     Pulses: Normal pulses.     Heart sounds: Normal heart sounds.  Pulmonary:     Effort: Pulmonary effort is normal. No respiratory distress.     Breath sounds: No wheezing.  Abdominal:     Palpations: Abdomen is soft.     Tenderness: There is no abdominal tenderness. There is no guarding or rebound.     Comments: Obese abdomen  Musculoskeletal:     Cervical back: Normal range of motion.     Right lower leg: No edema.     Left lower leg: No edema.  Skin:     General: Skin is warm and dry.     Capillary Refill: Capillary refill takes less than 2 seconds.  Neurological:     Mental Status: He is alert. Mental status is at baseline.  Psychiatric:        Mood and Affect: Mood normal.        Behavior: Behavior normal.     ED Results / Procedures / Treatments   Labs (all labs ordered are listed, but only abnormal results are displayed) Labs Reviewed  BASIC METABOLIC PANEL - Abnormal; Notable for the following components:      Result Value   Glucose, Bld 131 (*)    All other components within normal limits  CBC    EKG None  Radiology DG ABD ACUTE 2+V W 1V CHEST  Result Date: 05/26/2020 CLINICAL DATA:  Constipation EXAM: DG ABDOMEN ACUTE W/ 1V CHEST COMPARISON:  07/10/2013 FINDINGS: Calcified granuloma at the lateral left base. There is no edema, consolidation, effusion, or pneumothorax. Normal heart size and mediastinal contours. Degenerative endplate spurring. No noted stool retention but there is diffuse colonic gas with occasional fluid levels. Cholecystectomy clips. IMPRESSION: Prominent colonic gas with fluid levels. No underlying stool retention or rectal impaction. No evidence of acute cardiopulmonary disease. Electronically Signed   By: Marnee Spring M.D.   On: 05/26/2020 09:27    Procedures Procedures (including critical care time)  Medications Ordered in ED Medications - No data to display  ED Course  I have reviewed the triage vital signs and the nursing notes.  Pertinent labs & imaging results that were available during my care of the patient were reviewed by me and considered in my medical decision making (see chart for details).  Patient is 63 year old male with no pertinent past medical history presented today for change in his bowel habits.  Patient is a 64 year old male with no pertinent past medical history other than surgical history of cholecystectomy.  He is presented today with 2 days without abdominal pain.   He states he normally has a bowel movement every day.  He denies any abdominal pain nausea vomiting and states he feels well today.  Clinical Course as of May 26 1458  Wynelle Link May 26, 2020  1447 Plain x-ray of abdomen and chest shows no evidence of SBO.  There is diffuse gas in the colon.  No fecal impaction evident.  Discussed with Dr. Rhunette Croft who agrees with plan to discharge with close follow-up with PCP.  He was given return precautions.  MiraLAX bowel regimen.  BMP without any sniffing electrolyte abnormality CBC values such as as already BMP are vital signs are within normal limits.  Patient denies any pain or symptoms at this time no nausea or vomiting.   [WF]    Clinical Course User Index [WF] Solon Augusta  S, PA   Patient discharged with close return precautions and close follow-up with PCP.  Also given gastroenterology referral.  MDM Rules/Calculators/A&P                          Final Clinical Impression(s) / ED Diagnoses Final diagnoses:  Constipation, unspecified constipation type    Rx / DC Orders ED Discharge Orders    None       Gailen Shelter, [place] 05/26/20 1749    Derwood Kaplan, MD 05/27/20 1707

## 2020-05-26 NOTE — ED Triage Notes (Signed)
Patient reports to the ER for reportedly not having pooped in two days. Patient reports he is not supposed to have cheese but he had cheese and now has not pooped in 2 days. Patient reports he is not having any abdominal pain it is just discomfort.

## 2020-05-26 NOTE — ED Notes (Signed)
Pt discharged from this ED in stable condition at this time. All discharge instructions and follow up care reviewed with pt with no further questions at this time. Pt ambulatory with steady gait, clear speech.  

## 2020-05-27 ENCOUNTER — Encounter (HOSPITAL_COMMUNITY): Payer: Self-pay | Admitting: Emergency Medicine

## 2020-05-27 ENCOUNTER — Emergency Department (HOSPITAL_COMMUNITY): Payer: Self-pay

## 2020-05-27 ENCOUNTER — Other Ambulatory Visit: Payer: Self-pay

## 2020-05-27 ENCOUNTER — Emergency Department (HOSPITAL_COMMUNITY)
Admission: EM | Admit: 2020-05-27 | Discharge: 2020-05-27 | Disposition: A | Discharge status: Home / Self Care | Payer: Self-pay | Attending: Emergency Medicine | Admitting: Emergency Medicine

## 2020-05-27 DIAGNOSIS — R6883 Chills (without fever): Secondary | ICD-10-CM | POA: Insufficient documentation

## 2020-05-27 DIAGNOSIS — N201 Calculus of ureter: Secondary | ICD-10-CM | POA: Insufficient documentation

## 2020-05-27 DIAGNOSIS — Z20822 Contact with and (suspected) exposure to covid-19: Secondary | ICD-10-CM | POA: Insufficient documentation

## 2020-05-27 DIAGNOSIS — M545 Low back pain: Secondary | ICD-10-CM | POA: Insufficient documentation

## 2020-05-27 DIAGNOSIS — Z87891 Personal history of nicotine dependence: Secondary | ICD-10-CM | POA: Insufficient documentation

## 2020-05-27 LAB — CBC
HCT: 41.9 % (ref 39.0–52.0)
Hemoglobin: 13.5 g/dL (ref 13.0–17.0)
MCH: 26 pg (ref 26.0–34.0)
MCHC: 32.2 g/dL (ref 30.0–36.0)
MCV: 80.6 fL (ref 80.0–100.0)
Platelets: 211 10*3/uL (ref 150–400)
RBC: 5.2 MIL/uL (ref 4.22–5.81)
RDW: 13.7 % (ref 11.5–15.5)
WBC: 8.7 10*3/uL (ref 4.0–10.5)
nRBC: 0 % (ref 0.0–0.2)

## 2020-05-27 LAB — COMPREHENSIVE METABOLIC PANEL
ALT: 23 U/L (ref 0–44)
AST: 34 U/L (ref 15–41)
Albumin: 3.8 g/dL (ref 3.5–5.0)
Alkaline Phosphatase: 80 U/L (ref 38–126)
Anion gap: 13 (ref 5–15)
BUN: 14 mg/dL (ref 8–23)
CO2: 22 mmol/L (ref 22–32)
Calcium: 9.1 mg/dL (ref 8.9–10.3)
Chloride: 106 mmol/L (ref 98–111)
Creatinine, Ser: 1.56 mg/dL — ABNORMAL HIGH (ref 0.61–1.24)
GFR calc Af Amer: 54 mL/min — ABNORMAL LOW (ref >60)
GFR calc non Af Amer: 47 mL/min — ABNORMAL LOW (ref >60)
Glucose, Bld: 158 mg/dL — ABNORMAL HIGH (ref 70–99)
Potassium: 3.4 mmol/L — ABNORMAL LOW (ref 3.5–5.1)
Sodium: 141 mmol/L (ref 135–145)
Total Bilirubin: 0.4 mg/dL (ref 0.3–1.2)
Total Protein: 6.7 g/dL (ref 6.5–8.1)

## 2020-05-27 LAB — URINALYSIS, ROUTINE W REFLEX MICROSCOPIC
Bilirubin Urine: NEGATIVE
Glucose, UA: NEGATIVE mg/dL
Hgb urine dipstick: NEGATIVE
Ketones, ur: NEGATIVE mg/dL
Leukocytes,Ua: NEGATIVE
Nitrite: NEGATIVE
Protein, ur: NEGATIVE mg/dL
Specific Gravity, Urine: 1.029 (ref 1.005–1.030)
pH: 7 (ref 5.0–8.0)

## 2020-05-27 LAB — SARS CORONAVIRUS 2 BY RT PCR (HOSPITAL ORDER, PERFORMED IN ~~LOC~~ HOSPITAL LAB): SARS Coronavirus 2: NEGATIVE

## 2020-05-27 LAB — LIPASE, BLOOD: Lipase: 36 U/L (ref 11–51)

## 2020-05-27 MED ORDER — TAMSULOSIN HCL 0.4 MG PO CAPS: 0.4000 mg | ORAL_CAPSULE | Freq: Every day | ORAL | 0 refills | Status: AC

## 2020-05-27 MED ORDER — ONDANSETRON HCL 4 MG PO TABS: 4.0000 mg | ORAL_TABLET | Freq: Four times a day (QID) | ORAL | 0 refills | Status: AC

## 2020-05-27 MED ORDER — IOHEXOL 300 MG/ML  SOLN
100.0000 mL | Freq: Once | INTRAMUSCULAR | Status: AC | PRN
  Administered 2020-05-27: 100 mL via INTRAVENOUS

## 2020-05-27 MED ORDER — OXYCODONE-ACETAMINOPHEN 5-325 MG PO TABS: 2.0000 | ORAL_TABLET | ORAL | 0 refills | Status: AC | PRN

## 2020-05-27 MED ORDER — SODIUM CHLORIDE 0.9 % IV BOLUS
1000.0000 mL | Freq: Once | INTRAVENOUS | Status: AC
  Administered 2020-05-27: 1000 mL via INTRAVENOUS

## 2020-05-27 MED ORDER — MORPHINE SULFATE (PF) 2 MG/ML IV SOLN
2.0000 mg | Freq: Once | INTRAVENOUS | Status: AC
  Administered 2020-05-27: 2 mg via INTRAVENOUS
  Filled 2020-05-27: qty 1

## 2020-05-27 NOTE — ED Triage Notes (Signed)
Pt c/o left flank pain that radiates to his abdomen. Also c/o nausea. States he was seen at [facility] yesterday.

## 2020-05-27 NOTE — Discharge Instructions (Addendum)
Your work-up today was was positive for a left kidney stone.  We will send you home with some nausea medication, pain medication, and medication to help you pass the stone.  Please follow-up with Dr. Alvester Morin with urology.  If you have not heard from them in 24 to 48 hours, please call the office and let them know you are seen in the emergency department and need follow-up.

## 2020-05-27 NOTE — ED Notes (Signed)
Pt transported to CT ?

## 2020-05-27 NOTE — ED Provider Notes (Signed)
[facility] EMERGENCY DEPARTMENT Provider Note   CSN: [id number] Arrival date & time: 05/27/20  0115     History Chief Complaint  Patient presents with  . Abdominal Pain    Clayton Kirk is a 64 y.o. male.  HPI 64 year old male with no significant medical history presents to the ER with left flank pain that radiates into the abdomen which began around 12 AM this morning.  Patient reports some nausea, however no vomiting.  Describes the pain as sharp and stabbing.  Denies any urinary symptoms, no blood in his urine.  Has not taken anything for symptoms.  No history of kidney stones.  Denies any chest pain or shortness of breath.  He was seen at the [facility] long emergency department 2 days prior complaining of abdominal pain, had some plain films done, suspicious of constipation and was told to take a stool softener.  He states that shortly after he went to the ED, he had a normal bowel movement.  Has been having normal bowel movements since.  No noticeable blood.  Denies any fevers, states he has felt chilled at times.  Endorsed dizziness in triage, however denies this at this time.    History reviewed. No pertinent past medical history.  There are no problems to display for this patient.   History reviewed. No pertinent surgical history.     No family history on file.  Social History   Tobacco Use  . Smoking status: Former Games developer  . Smokeless tobacco: Never Used  Substance Use Topics  . Alcohol use: Not Currently  . Drug use: Not Currently    Home Medications Prior to Admission medications   Medication Sig Start Date End Date Taking? Authorizing Provider  ondansetron (ZOFRAN) 4 MG tablet Take 1 tablet (4 mg total) by mouth every 6 (six) hours. 05/27/20   Mare Ferrari, PA-C  oxyCODONE-acetaminophen (PERCOCET/ROXICET) 5-325 MG tablet Take 2 tablets by mouth every 4 (four) hours as needed for up to 3 days for severe pain. 05/27/20 05/30/20  Mare Ferrari, PA-C    sodium-potassium bicarbonate (ALKA-SELTZER GOLD) TBEF dissolvable tablet Take 2 tablets by mouth daily as needed.    [provider]  tamsulosin (FLOMAX) 0.4 MG CAPS capsule Take 1 capsule (0.4 mg total) by mouth daily for 10 days. 05/27/20 06/06/20  Mare Ferrari, PA-C    Allergies    Patient has no known allergies.  Review of Systems   Review of Systems  Constitutional: Positive for chills. Negative for fever.  HENT: Negative for ear pain and sore throat.   Eyes: Negative for pain and visual disturbance.  Respiratory: Negative for cough and shortness of breath.   Cardiovascular: Negative for chest pain and palpitations.  Gastrointestinal: Positive for abdominal pain and nausea. Negative for vomiting.  Genitourinary: Negative for dysuria and hematuria.  Musculoskeletal: Positive for back pain. Negative for arthralgias.  Skin: Negative for color change and rash.  Neurological: Negative for seizures and syncope.  All other systems reviewed and are negative.   Physical Exam Updated Vital Signs BP (!) 160/81   Pulse 84   Temp 98.9 F (37.2 C) (Oral)   Resp (!) 24   SpO2 98%   Physical Exam Vitals and nursing note reviewed.  Constitutional:      General: He is not in acute distress.    Appearance: He is well-developed. He is not ill-appearing or toxic-appearing.  HENT:     Head: Normocephalic and atraumatic.  Eyes:  Conjunctiva/sclera: Conjunctivae normal.  Cardiovascular:     Rate and Rhythm: Normal rate and regular rhythm.     Heart sounds: Normal heart sounds. No murmur heard.   Pulmonary:     Effort: Pulmonary effort is normal. No respiratory distress.     Breath sounds: Normal breath sounds.  Abdominal:     General: Abdomen is flat. Bowel sounds are normal. There is no distension.     Palpations: Abdomen is soft.     Tenderness: There is abdominal tenderness in the left lower quadrant. There is left CVA tenderness. There is no right CVA tenderness or  guarding. Negative signs include Murphy's sign, McBurney's sign and obturator sign.  Musculoskeletal:     Cervical back: Neck supple.  Skin:    General: Skin is warm and dry.  Neurological:     General: No focal deficit present.     Mental Status: He is alert.  Psychiatric:        Mood and Affect: Mood normal.        Behavior: Behavior normal.     ED Results / Procedures / Treatments   Labs (all labs ordered are listed, but only abnormal results are displayed) Labs Reviewed  COMPREHENSIVE METABOLIC PANEL - Abnormal; Notable for the following components:      Result Value   Potassium 3.4 (*)    Glucose, Bld 158 (*)    Creatinine, Ser 1.56 (*)    GFR calc non Af Amer 47 (*)    GFR calc Af Amer 54 (*)    All other components within normal limits  SARS CORONAVIRUS 2 BY RT PCR (HOSPITAL ORDER, PERFORMED IN [facility])  LIPASE, BLOOD  CBC  URINALYSIS, ROUTINE W REFLEX MICROSCOPIC    EKG None  Radiology CT ABDOMEN PELVIS W CONTRAST  Result Date: 05/27/2020 CLINICAL DATA:  Left flank and left lower quadrant abdominal pain with nausea. EXAM: CT ABDOMEN AND PELVIS WITH CONTRAST TECHNIQUE: Multidetector CT imaging of the abdomen and pelvis was performed using the standard protocol following bolus administration of intravenous contrast. CONTRAST:  OMNIPAQUE IOHEXOL 300 MG/ML  SOLN COMPARISON:  Radiographs 05/26/2020. Ultrasound 05/17/2005 and chest CT 05/18/2005. FINDINGS: Lower chest: Atherosclerosis of the aorta and coronary arteries. There is mild dependent atelectasis or scarring at both lung bases. There is a calcified left lower lobe granuloma. No pleural or pericardial effusion. Hepatobiliary: No focal hepatic abnormality or abnormal enhancement. Mild extrahepatic biliary dilatation is within physiologic limits post cholecystectomy. Pancreas: Unremarkable. No pancreatic ductal dilatation or surrounding inflammatory changes. Spleen: Normal in size without focal  abnormality. Adrenals/Urinary Tract: Both adrenal glands appear normal. The left kidney demonstrates asymmetric perinephric and periureteral soft tissue stranding, delayed contrast excretion and mild hydroureter proximal to a probable 3 mm calculus distally, located approximately 5 cm above the ureterovesical junction (image 76/3). This is not clearly seen on the patient's scout image. The right kidney appears normal. No other urinary tract calculi are seen. The bladder appears unremarkable. Stomach/Bowel: No evidence of bowel wall thickening, distention or surrounding inflammatory change. The appendix appears normal. Vascular/Lymphatic: There are no enlarged abdominal or pelvic lymph nodes. Aortic and branch vessel atherosclerosis. No acute vascular findings. The portal, superior mesenteric and splenic veins are patent. Reproductive: Prostate gland and seminal vesicles appear normal. Other: No evidence of abdominal wall mass or hernia. No ascites. Musculoskeletal: No acute or significant osseous findings. Mild thoracolumbar spondylosis. IMPRESSION: 1. Obstructing 3 mm calculus in the distal left ureter with delayed contrast  excretion and possible mild pyelosinus extravasation. 2. No other urinary tract calculi. 3. Aortic Atherosclerosis (ICD10-I70.0). Electronically Signed   By: Carey BullocksWilliam  Veazey M.D.   On: 05/27/2020 10:37   DG ABD ACUTE 2+V W 1V CHEST  Result Date: 05/26/2020 CLINICAL DATA:  Constipation EXAM: DG ABDOMEN ACUTE W/ 1V CHEST COMPARISON:  07/10/2013 FINDINGS: Calcified granuloma at the lateral left base. There is no edema, consolidation, effusion, or pneumothorax. Normal heart size and mediastinal contours. Degenerative endplate spurring. No noted stool retention but there is diffuse colonic gas with occasional fluid levels. Cholecystectomy clips. IMPRESSION: Prominent colonic gas with fluid levels. No underlying stool retention or rectal impaction. No evidence of acute cardiopulmonary disease.  Electronically Signed   By: Marnee SpringJonathon  Watts M.D.   On: 05/26/2020 09:27    Procedures Procedures (including critical care time)  Medications Ordered in ED Medications  morphine 2 MG/ML injection 2 mg (2 mg Intravenous Given 05/27/20 0924)  sodium chloride 0.9 % bolus 1,000 mL (0 mLs Intravenous Stopped 05/27/20 1054)  iohexol (OMNIPAQUE) 300 MG/ML solution 100 mL (100 mLs Intravenous Contrast Given 05/27/20 1006)  morphine 2 MG/ML injection 2 mg (2 mg Intravenous Given 05/27/20 1035)    ED Course  I have reviewed the triage vital signs and the nursing notes.  Pertinent labs & imaging results that were available during my care of the patient were reviewed by me and considered in my medical decision making (see chart for details).    MDM Rules/Calculators/A&P                          Patient with left flank pain rating into his abdomen On presentation, is alert, oriented, nontoxic-appearing, no acute distress, nondiaphoretic, speaking full sentences without increased work of breathing.  Vitals overall reassuring.  Physical exam with left CVA tenderness, left lower quadrant tenderness to palpation.  Lung sounds clear.  DDx includes kidney stones, diverticulitis, constipation/SBO, dissection  Personally reviewed and interpreted his labs CBC without leukocytosis, normal hemoglobin. CMP with a potassium of 3.4, creatinine of 1.56 slightly elevated from baseline.  GFR 47. Lipase normal UA without evidence of UTI, no blood.  EKG normal sinus rhythm.  IMAGING:  CT abdomen pelvis 3 mm left obstructing stone; no other intra-bominal process  MDM: Patient is denying any chest pain shortness of breath, vitals reassuring, doubt dissection at this time.  CTA ruling out diverticulitis/SBO.  Consistent with left 3 mm obstructing calculus.  Spoke with Dr. Alvester MorinBell with urology who recommends Flomax, Zofran and pain medication, patient will be seen in the office on Tuesday.  Reported this to the patient, who  is overall reassured.  On reevaluation, patient reports significant relief in his pain after 4 mg of morphine.  DISPOSITION: Stable for discharge with close urology follow-up.  We will start Flomax, Zofran and Percocet. PDMP reviewed, no scheduled scripts.  Return precautions discussed.  He voiced understanding and is agreeable.  Discussed case with Dr. Stevie Kernykstra who is agreeable to the above plan and disposition.   Final Clinical Impression(s) / ED Diagnoses Final diagnoses:  Left ureteral stone    Rx / DC Orders ED Discharge Orders         Ordered    oxyCODONE-acetaminophen (PERCOCET/ROXICET) 5-325 MG tablet  Every 4 hours PRN        05/27/20 1201    ondansetron (ZOFRAN) 4 MG tablet  Every 6 hours        05/27/20 1201  tamsulosin (FLOMAX) 0.4 MG CAPS capsule  Daily        05/27/20 1201           Leone Brand 05/27/20 1203    Milagros Loll, MD 05/29/20 2138

## 2020-05-27 NOTE — ED Notes (Signed)
Pt states he feels like he will pass out, new vitals obtained.

## 2020-06-11 ENCOUNTER — Ambulatory Visit: Payer: Self-pay | Admitting: Internal Medicine

## 2020-11-30 ENCOUNTER — Encounter (HOSPITAL_COMMUNITY): Payer: Self-pay

## 2020-11-30 ENCOUNTER — Other Ambulatory Visit: Payer: Self-pay

## 2020-11-30 ENCOUNTER — Emergency Department (HOSPITAL_COMMUNITY)
Admission: EM | Admit: 2020-11-30 | Discharge: 2020-11-30 | Disposition: A | Discharge status: Home / Self Care | Payer: Self-pay | Attending: Emergency Medicine | Admitting: Emergency Medicine

## 2020-11-30 DIAGNOSIS — T781XXA Other adverse food reactions, not elsewhere classified, initial encounter: Secondary | ICD-10-CM

## 2020-11-30 DIAGNOSIS — T7802XA Anaphylactic reaction due to shellfish (crustaceans), initial encounter: Secondary | ICD-10-CM | POA: Insufficient documentation

## 2020-11-30 DIAGNOSIS — Z87891 Personal history of nicotine dependence: Secondary | ICD-10-CM | POA: Insufficient documentation

## 2020-11-30 MED ORDER — EPINEPHRINE 0.3 MG/0.3ML IJ SOAJ
0.3000 mg | Freq: Once | INTRAMUSCULAR | Status: AC
  Administered 2020-11-30: 0.3 mg via INTRAMUSCULAR
  Filled 2020-11-30: qty 0.3

## 2020-11-30 MED ORDER — METHYLPREDNISOLONE SODIUM SUCC 125 MG IJ SOLR
125.0000 mg | Freq: Once | INTRAMUSCULAR | Status: AC
  Administered 2020-11-30: 125 mg via INTRAVENOUS
  Filled 2020-11-30: qty 2

## 2020-11-30 MED ORDER — EPINEPHRINE 0.3 MG/0.3ML IJ SOAJ
0.3000 mg | INTRAMUSCULAR | 0 refills | Status: AC | PRN
Start: 2020-11-30 — End: ?

## 2020-11-30 MED ORDER — FAMOTIDINE IN NACL 20-0.9 MG/50ML-% IV SOLN
20.0000 mg | Freq: Once | INTRAVENOUS | Status: AC
  Administered 2020-11-30: 20 mg via INTRAVENOUS
  Filled 2020-11-30: qty 50

## 2020-11-30 MED ORDER — PREDNISONE 50 MG PO TABS: 50.0000 mg | ORAL_TABLET | Freq: Every day | ORAL | 0 refills | Status: AC

## 2020-11-30 MED ORDER — DIPHENHYDRAMINE HCL 50 MG/ML IJ SOLN
25.0000 mg | Freq: Once | INTRAMUSCULAR | Status: AC
Start: 2020-11-30 — End: 2020-11-30
  Administered 2020-11-30: 25 mg via INTRAVENOUS
  Filled 2020-11-30: qty 1

## 2020-11-30 NOTE — Discharge Instructions (Addendum)
Take loratadine (Claritin) or Cetirizine (Zyrtec) once a day for the next week.  Take diphenhydramine (Benadryl) as needed.  Return if you are having more trouble breathing or swallowing.  If you ever have to use the EpiPen, come to the emergency department immediately after using it.  EpiPen helps control the symptoms but does not cure the problem and, when it wears off, your reaction might get worse.

## 2020-11-30 NOTE — ED Triage Notes (Signed)
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

## 2020-11-30 NOTE — ED Provider Notes (Signed)
[facility] EMERGENCY DEPARTMENT Provider Note   CSN: [id number] Arrival date & time: 11/30/20  0315   History Chief Complaint  Patient presents with  . Allergic Reaction    Clayton Kirk is a 65 y.o. male.  The history is provided by the patient.  Allergic Reaction He has history of shrimp allergy and had eaten shrimp at lunchtime.  At 1:30 AM, he noted swelling of his eyes and felt like his throat might be closing off.  He denies any itching and denies any dyspnea.  There has been no nausea or vomiting.  He has not taken anything for the reaction.  History reviewed. No pertinent past medical history.  There are no problems to display for this patient.   History reviewed. No pertinent surgical history.     History reviewed. No pertinent family history.  Social History   Tobacco Use  . Smoking status: Former Games developer  . Smokeless tobacco: Never Used  Substance Use Topics  . Alcohol use: Not Currently  . Drug use: Not Currently    Home Medications Prior to Admission medications   Medication Sig Start Date End Date Taking? Authorizing Provider  ondansetron (ZOFRAN) 4 MG tablet Take 1 tablet (4 mg total) by mouth every 6 (six) hours. 05/27/20   Mare Ferrari, PA-C  sodium-potassium bicarbonate (ALKA-SELTZER GOLD) TBEF dissolvable tablet Take 2 tablets by mouth daily as needed.    [provider]    Allergies    Shrimp (diagnostic)  Review of Systems   Review of Systems  All other systems reviewed and are negative.   Physical Exam Updated Vital Signs BP (!) 153/86 (BP Location: Right Arm)   Pulse 89   Temp 98.7 F (37.1 C) (Oral)   Resp (!) 22   Ht 6\' 4"  (1.93 m)   Wt 120.2 kg   SpO2 96%   BMI 32.26 kg/m   Physical Exam Vitals and nursing note reviewed.   65 year old male, resting comfortably and in no acute distress. Vital signs are significant for elevated blood pressure and slightly elevated respiratory rate. Oxygen  saturation is 96%, which is normal. Head is normocephalic and atraumatic. PERRLA, EOMI. there is mild periorbital edema as well as some erythema and swelling in the malar area.  There is no swelling of the tongue or sublingual tissues but there is minimal edema of the uvula.  There is no pooling of secretions and phonation is normal. Neck is nontender and supple without adenopathy or JVD. Back is nontender and there is no CVA tenderness. Lungs are clear without rales, wheezes, or rhonchi. Chest is nontender. Heart has regular rate and rhythm without murmur. Abdomen is soft, flat, nontender without masses or hepatosplenomegaly and peristalsis is normoactive. Extremities have no cyanosis or edema, full range of motion is present. Skin is warm and dry without rash. Neurologic: Mental status is normal, cranial nerves are intact, there are no motor or sensory deficits.  ED Results / Procedures / Treatments    Procedures Procedures  CRITICAL CARE Performed by: 65 Total critical care time: 45 minutes Critical care time was exclusive of separately billable procedures and treating other patients. Critical care was necessary to treat or prevent imminent or life-threatening deterioration. Critical care was time spent personally by me on the following activities: development of treatment plan with patient and/or surrogate as well as nursing, discussions with consultants, evaluation of patient's response to treatment, examination of patient, obtaining history from patient or surrogate,  ordering and performing treatments and interventions, ordering and review of laboratory studies, ordering and review of radiographic studies, pulse oximetry and re-evaluation of patient's condition.  Medications Ordered in ED Medications  methylPREDNISolone sodium succinate (SOLU-MEDROL) 125 mg/2 mL injection 125 mg (has no administration in time range)  diphenhydrAMINE (BENADRYL) injection 25 mg (has no  administration in time range)  famotidine (PEPCID) IVPB 20 mg premix (has no administration in time range)  EPINEPHrine (EPI-PEN) injection 0.3 mg (has no administration in time range)    ED Course  I have reviewed the triage vital signs and the nursing notes.  MDM Rules/Calculators/A&P Allergic reaction although I somewhat doubt it is related to the shrimp as it came on about 12 hours after the shrimp exposure.  He will be given diphenhydramine, famotidine, methylprednisolone intravenously and subcutaneous epinephrine.  Old records are reviewed, and I see no relevant past visits.  Patient was observed for over 3 hours in the ED.  During this time, swelling has decreased and patient states he is feeling better.  He is discharged with a prescription for prednisone and also for an EpiPen.  He is advised to use over-the-counter second-generation antihistamines for the next week, return if he has any further problems.  Final Clinical Impression(s) / ED Diagnoses Final diagnoses:  Allergic reaction to food, initial encounter    Rx / DC Orders ED Discharge Orders         Ordered    predniSONE (DELTASONE) 50 MG tablet  Daily        11/30/20 0620    EPINEPHrine 0.3 mg/0.3 mL IJ SOAJ injection  As needed        11/30/20 Terrall Laity, MD 11/30/20 0710

## 2021-01-14 ENCOUNTER — Emergency Department (HOSPITAL_COMMUNITY)
Admission: EM | Admit: 2021-01-14 | Discharge: 2021-01-14 | Disposition: A | Discharge status: Home / Self Care | Payer: Self-pay | Attending: Emergency Medicine | Admitting: Emergency Medicine

## 2021-01-14 ENCOUNTER — Other Ambulatory Visit: Payer: Self-pay

## 2021-01-14 ENCOUNTER — Emergency Department (HOSPITAL_COMMUNITY): Payer: Self-pay

## 2021-01-14 ENCOUNTER — Encounter (HOSPITAL_COMMUNITY): Payer: Self-pay | Admitting: Emergency Medicine

## 2021-01-14 DIAGNOSIS — J069 Acute upper respiratory infection, unspecified: Secondary | ICD-10-CM | POA: Insufficient documentation

## 2021-01-14 DIAGNOSIS — Z20822 Contact with and (suspected) exposure to covid-19: Secondary | ICD-10-CM | POA: Insufficient documentation

## 2021-01-14 DIAGNOSIS — Z87891 Personal history of nicotine dependence: Secondary | ICD-10-CM | POA: Insufficient documentation

## 2021-01-14 DIAGNOSIS — R509 Fever, unspecified: Secondary | ICD-10-CM

## 2021-01-14 LAB — CBC WITH DIFFERENTIAL/PLATELET
Abs Immature Granulocytes: 0.03 10*3/uL (ref 0.00–0.07)
Basophils Absolute: 0 10*3/uL (ref 0.0–0.1)
Basophils Relative: 0 %
Eosinophils Absolute: 0.1 10*3/uL (ref 0.0–0.5)
Eosinophils Relative: 1 %
HCT: 44 % (ref 39.0–52.0)
Hemoglobin: 14.3 g/dL (ref 13.0–17.0)
Immature Granulocytes: 1 %
Lymphocytes Relative: 14 %
Lymphs Abs: 0.6 10*3/uL — ABNORMAL LOW (ref 0.7–4.0)
MCH: 26.1 pg (ref 26.0–34.0)
MCHC: 32.5 g/dL (ref 30.0–36.0)
MCV: 80.3 fL (ref 80.0–100.0)
Monocytes Absolute: 0.6 10*3/uL (ref 0.1–1.0)
Monocytes Relative: 14 %
Neutro Abs: 3.2 10*3/uL (ref 1.7–7.7)
Neutrophils Relative %: 70 %
Platelets: 144 10*3/uL — ABNORMAL LOW (ref 150–400)
RBC: 5.48 MIL/uL (ref 4.22–5.81)
RDW: 13.6 % (ref 11.5–15.5)
WBC: 4.5 10*3/uL (ref 4.0–10.5)
nRBC: 0 % (ref 0.0–0.2)

## 2021-01-14 LAB — BASIC METABOLIC PANEL
Anion gap: 8 (ref 5–15)
BUN: 9 mg/dL (ref 8–23)
CO2: 21 mmol/L — ABNORMAL LOW (ref 22–32)
Calcium: 8.6 mg/dL — ABNORMAL LOW (ref 8.9–10.3)
Chloride: 106 mmol/L (ref 98–111)
Creatinine, Ser: 1.12 mg/dL (ref 0.61–1.24)
GFR, Estimated: >60 mL/min (ref >60)
Glucose, Bld: 116 mg/dL — ABNORMAL HIGH (ref 70–99)
Potassium: 3.9 mmol/L (ref 3.5–5.1)
Sodium: 135 mmol/L (ref 135–145)

## 2021-01-14 LAB — POC SARS CORONAVIRUS 2 AG -  ED: SARSCOV2ONAVIRUS 2 AG: NEGATIVE

## 2021-01-14 MED ORDER — ACETAMINOPHEN 500 MG PO TABS
1000.0000 mg | ORAL_TABLET | Freq: Once | ORAL | Status: AC
  Administered 2021-01-14: 1000 mg via ORAL
  Filled 2021-01-14: qty 2

## 2021-01-14 MED ORDER — IBUPROFEN 400 MG PO TABS
600.0000 mg | ORAL_TABLET | Freq: Once | ORAL | Status: AC
  Administered 2021-01-14: 600 mg via ORAL
  Filled 2021-01-14: qty 1

## 2021-01-14 NOTE — ED Notes (Signed)
reassess patient due to fever . Pt denies any sob,cp,N&V, neck pain, back pain, abd pain. Pt reports headache and nasal congestion. Tylenol order per standing protocol. Pt has NKA. Pt denies any liver diease.

## 2021-01-14 NOTE — ED Triage Notes (Signed)
Pt c/o headache that started yesterday. Denies nausea/vomiting, no changes to vision, A&O x 4, moves all extremities well.

## 2021-01-14 NOTE — ED Provider Notes (Signed)
[facility] EMERGENCY DEPARTMENT Provider Note   CSN: [id number] Arrival date & time: 01/14/21  0003     History Chief Complaint  Patient presents with  . Headache    Clayton Kirk is a 65 y.o. male.  HPI     This is a 65 year old male with no reported past medical history who presents with headache.  Patient reports onset of headache yesterday.  He also reports nasal congestion.  No cough, shortness of breath, chest pain.  No known sick contacts or COVID exposures.  Was noted to be febrile in triage 102.5.  Had not noted any chills or fevers at home.  He was given Tylenol.  On my evaluation, he is awake, alert, oriented.  He denies any symptoms at this time including headache.  He denies cough, nausea, vomiting.  He denies neck stiffness or sore throat.  He reports that he is back to his baseline.  History reviewed. No pertinent past medical history.  There are no problems to display for this patient.   History reviewed. No pertinent surgical history.     No family history on file.  Social History   Tobacco Use  . Smoking status: Former Games developer  . Smokeless tobacco: Never Used  Substance Use Topics  . Alcohol use: Not Currently  . Drug use: Not Currently    Home Medications Prior to Admission medications   Medication Sig Start Date End Date Taking? Authorizing Provider  EPINEPHrine 0.3 mg/0.3 mL IJ SOAJ injection Inject 0.3 mg into the muscle as needed for anaphylaxis. 11/30/20   Dione Booze, MD  ondansetron (ZOFRAN) 4 MG tablet Take 1 tablet (4 mg total) by mouth every 6 (six) hours. 05/27/20   Mare Ferrari, PA-C  predniSONE (DELTASONE) 50 MG tablet Take 1 tablet (50 mg total) by mouth daily. 11/30/20   Dione Booze, MD  sodium-potassium bicarbonate (ALKA-SELTZER GOLD) TBEF dissolvable tablet Take 2 tablets by mouth daily as needed.    [provider]    Allergies    Shrimp (diagnostic)  Review of Systems   Review of Systems   Constitutional: Positive for fever.  HENT: Positive for congestion. Negative for sore throat.   Respiratory: Negative for shortness of breath.   Cardiovascular: Negative for chest pain.  Gastrointestinal: Negative for abdominal pain, nausea and vomiting.  Genitourinary: Negative for dysuria.  All other systems reviewed and are negative.   Physical Exam Updated Vital Signs BP (!) 158/100 (BP Location: Right Arm)   Pulse 74   Temp 98.8 F (37.1 C) (Oral)   Resp (!) 22   SpO2 92%   Physical Exam Vitals and nursing note reviewed.  Constitutional:      Appearance: He is well-developed. He is not ill-appearing.  HENT:     Head: Normocephalic and atraumatic.     Mouth/Throat:     Mouth: Mucous membranes are moist.     Comments: No posterior oropharyngeal erythema Eyes:     Pupils: Pupils are equal, round, and reactive to light.  Neck:     Comments: No meningismus Cardiovascular:     Rate and Rhythm: Normal rate and regular rhythm.     Heart sounds: Normal heart sounds. No murmur heard.   Pulmonary:     Effort: Pulmonary effort is normal. No respiratory distress.     Breath sounds: Normal breath sounds. No wheezing.  Abdominal:     General: Bowel sounds are normal.     Palpations: Abdomen is soft.  Tenderness: There is no abdominal tenderness. There is no rebound.  Musculoskeletal:     Cervical back: Normal range of motion and neck supple.  Lymphadenopathy:     Cervical: No cervical adenopathy.  Skin:    General: Skin is warm and dry.  Neurological:     Mental Status: He is alert and oriented to person, place, and time.     ED Results / Procedures / Treatments   Labs (all labs ordered are listed, but only abnormal results are displayed) Labs Reviewed  BASIC METABOLIC PANEL - Abnormal; Notable for the following components:      Result Value   CO2 21 (*)    Glucose, Bld 116 (*)    Calcium 8.6 (*)    All other components within normal limits  CBC WITH  DIFFERENTIAL/PLATELET - Abnormal; Notable for the following components:   Platelets 144 (*)    Lymphs Abs 0.6 (*)    All other components within normal limits  POC SARS CORONAVIRUS 2 AG -  ED    EKG None  Radiology DG Chest 2 View  Result Date: 01/14/2021 CLINICAL DATA:  65 year old male with fever and headache since yesterday. EXAM: CHEST - 2 VIEW COMPARISON:  Chest radiographs 07/10/2013 and earlier. FINDINGS: Increased tortuosity of the thoracic aorta since 2014. But Other mediastinal contours are within normal limits. Stable lung volumes. Mild eventration of the right hemidiaphragm is stable, normal variant. Visualized tracheal air column is within normal limits. No pneumothorax, pulmonary edema, pleural effusion or confluent pulmonary opacity. There is a chronic calcified granuloma at the left costophrenic angle. No acute osseous abnormality identified. Negative visible bowel gas pattern. IMPRESSION: No acute cardiopulmonary abnormality. Electronically Signed   By: Odessa Fleming M.D.   On: 01/14/2021 06:01    Procedures Procedures   Medications Ordered in ED Medications  acetaminophen (TYLENOL) tablet 1,000 mg (1,000 mg Oral Given 01/14/21 0349)  ibuprofen (ADVIL) tablet 600 mg (600 mg Oral Given 01/14/21 0359)    ED Course  I have reviewed the triage vital signs and the nursing notes.  Pertinent labs & imaging results that were available during my care of the patient were reviewed by me and considered in my medical decision making (see chart for details).    MDM Rules/Calculators/A&P                          Patient presents initially with headache.  Noted to be febrile.  Was unaware that he was febrile at home.  Patient currently is without complaint.  Fever was treated with Tylenol and headache resolved.  He only otherwise reports congestion.  COVID testing is negative.  Basic lab work obtained.  No significant leukocytosis or metabolic derangements.  I did obtain a chest x-ray to  rule out pneumonia.  This was negative.  There is some prevalence of influenza in the community at this time.  We discussed this.  He will treat himself supportively.  Recommend Tylenol or Motrin for fever.  No meningismus to suggest meningitis.  After history, exam, and medical workup I feel the patient has been appropriately medically screened and is safe for discharge home. Pertinent diagnoses were discussed with the patient. Patient was given return precautions.  Final Clinical Impression(s) / ED Diagnoses Final diagnoses:  Viral upper respiratory tract infection  Fever, unspecified fever cause    Rx / DC Orders ED Discharge Orders    None       Tiwanna Tuch,  Mayer Masker, MD 01/14/21 [phone]

## 2021-01-14 NOTE — ED Provider Notes (Signed)
MSE was initiated and I personally evaluated the patient and placed orders (if any) at  12:15 AM on January 14, 2021.  The patient appears stable so that the remainder of the MSE may be completed by another provider.  Patient placed in Quick Look pathway, seen and evaluated   Chief Complaint: headache  HPI:   65 y.o. M who presents for HA that started yesterday. Reports it started off mild and progressively go worse. He has taken alkaseltzer and cough syrup with no improvement. No fevers, nausea/vomiting, numbness/weakness of his arms/legs.   ROS: HA (one)  Physical Exam:   Gen: No distress  Neuro: Awake and Alert  Skin: Warm    Focused Exam: Neck is supple and without rigiditity. 5/5 strength of BUE and BLE.    Initiation of care has begun. The patient has been counseled on the process, plan, and necessity for staying for the completion/evaluation, and the remainder of the medical screening examination  Portions of this note were generated with Dragon dictation software. Dictation errors may occur despite best attempts at proofreading.     Maxwell Caul, PA-C 01/14/21 2841    Shon Baton, MD 01/14/21 0110

## 2021-01-14 NOTE — Discharge Instructions (Addendum)
Seen today for headache.  You are noted to have a fever.  You likely have a viral infection.  Your COVID testing is negative.  Make sure that you are taking ibuprofen or Tylenol for your fevers.  If you develop new or worsening symptoms such as shortness of breath, productive cough, any new or worsening symptoms you should be reevaluated.
